# Patient Record
Sex: Female | Born: 1935 | Race: White | Hispanic: No | Marital: Married | Smoking: Never smoker
Health system: Southern US, Community
[De-identification: ages and names within clinical notes are randomized; demographics above are authoritative.]

## PROBLEM LIST (undated history)

## (undated) DIAGNOSIS — K589 Irritable bowel syndrome without diarrhea: Secondary | ICD-10-CM

## (undated) DIAGNOSIS — I10 Essential (primary) hypertension: Secondary | ICD-10-CM

## (undated) DIAGNOSIS — E785 Hyperlipidemia, unspecified: Secondary | ICD-10-CM

## (undated) DIAGNOSIS — F0390 Unspecified dementia without behavioral disturbance: Secondary | ICD-10-CM

## (undated) DIAGNOSIS — M1990 Unspecified osteoarthritis, unspecified site: Secondary | ICD-10-CM

## (undated) DIAGNOSIS — K219 Gastro-esophageal reflux disease without esophagitis: Secondary | ICD-10-CM

## (undated) DIAGNOSIS — E039 Hypothyroidism, unspecified: Secondary | ICD-10-CM

## (undated) HISTORY — PX: ABDOMINAL HYSTERECTOMY: SHX81

---

## 1997-12-29 ENCOUNTER — Ambulatory Visit (HOSPITAL_COMMUNITY): Admission: RE | Admit: 1997-12-29 | Discharge: 1997-12-29 | Payer: Self-pay | Admitting: Obstetrics and Gynecology

## 1998-02-18 ENCOUNTER — Other Ambulatory Visit: Admission: RE | Admit: 1998-02-18 | Discharge: 1998-02-18 | Payer: Self-pay | Admitting: Obstetrics and Gynecology

## 1999-01-19 ENCOUNTER — Encounter: Payer: Self-pay | Admitting: Obstetrics and Gynecology

## 1999-01-19 ENCOUNTER — Ambulatory Visit (HOSPITAL_COMMUNITY): Admission: RE | Admit: 1999-01-19 | Discharge: 1999-01-19 | Payer: Self-pay | Admitting: Obstetrics and Gynecology

## 1999-02-04 ENCOUNTER — Other Ambulatory Visit: Admission: RE | Admit: 1999-02-04 | Discharge: 1999-02-04 | Payer: Self-pay | Admitting: Obstetrics and Gynecology

## 1999-12-03 ENCOUNTER — Encounter: Admission: RE | Admit: 1999-12-03 | Discharge: 1999-12-03 | Payer: Self-pay | Admitting: Family Medicine

## 1999-12-03 ENCOUNTER — Encounter: Payer: Self-pay | Admitting: Family Medicine

## 2000-01-06 ENCOUNTER — Other Ambulatory Visit: Admission: RE | Admit: 2000-01-06 | Discharge: 2000-01-06 | Payer: Self-pay | Admitting: Obstetrics and Gynecology

## 2000-07-26 ENCOUNTER — Encounter: Payer: Self-pay | Admitting: Urology

## 2000-07-31 ENCOUNTER — Observation Stay (HOSPITAL_COMMUNITY): Admission: RE | Admit: 2000-07-31 | Discharge: 2000-08-01 | Payer: Self-pay | Admitting: Urology

## 2000-11-29 ENCOUNTER — Encounter: Admission: RE | Admit: 2000-11-29 | Discharge: 2000-11-29 | Payer: Self-pay | Admitting: Urology

## 2000-11-29 ENCOUNTER — Encounter: Payer: Self-pay | Admitting: Urology

## 2000-12-04 ENCOUNTER — Encounter (INDEPENDENT_AMBULATORY_CARE_PROVIDER_SITE_OTHER): Payer: Self-pay

## 2000-12-04 ENCOUNTER — Ambulatory Visit (HOSPITAL_COMMUNITY): Admission: RE | Admit: 2000-12-04 | Discharge: 2000-12-04 | Payer: Self-pay | Admitting: Urology

## 2001-04-17 ENCOUNTER — Other Ambulatory Visit: Admission: RE | Admit: 2001-04-17 | Discharge: 2001-04-17 | Payer: Self-pay | Admitting: Obstetrics and Gynecology

## 2001-07-16 ENCOUNTER — Other Ambulatory Visit: Admission: RE | Admit: 2001-07-16 | Discharge: 2001-07-16 | Payer: Self-pay | Admitting: Obstetrics and Gynecology

## 2001-12-19 ENCOUNTER — Encounter: Payer: Self-pay | Admitting: *Deleted

## 2001-12-19 ENCOUNTER — Encounter: Admission: RE | Admit: 2001-12-19 | Discharge: 2001-12-19 | Payer: Self-pay | Admitting: *Deleted

## 2002-02-12 ENCOUNTER — Ambulatory Visit (HOSPITAL_COMMUNITY): Admission: RE | Admit: 2002-02-12 | Discharge: 2002-02-12 | Payer: Self-pay | Admitting: Gastroenterology

## 2002-03-28 ENCOUNTER — Ambulatory Visit (HOSPITAL_COMMUNITY): Admission: RE | Admit: 2002-03-28 | Discharge: 2002-03-28 | Payer: Self-pay | Admitting: *Deleted

## 2002-05-20 ENCOUNTER — Ambulatory Visit (HOSPITAL_BASED_OUTPATIENT_CLINIC_OR_DEPARTMENT_OTHER): Admission: RE | Admit: 2002-05-20 | Discharge: 2002-05-20 | Payer: Self-pay | Admitting: Urology

## 2002-05-20 ENCOUNTER — Encounter: Payer: Self-pay | Admitting: Urology

## 2002-07-29 ENCOUNTER — Ambulatory Visit (HOSPITAL_BASED_OUTPATIENT_CLINIC_OR_DEPARTMENT_OTHER): Admission: RE | Admit: 2002-07-29 | Discharge: 2002-07-29 | Payer: Self-pay | Admitting: Urology

## 2002-08-20 ENCOUNTER — Other Ambulatory Visit: Admission: RE | Admit: 2002-08-20 | Discharge: 2002-08-20 | Payer: Self-pay | Admitting: Obstetrics and Gynecology

## 2003-03-03 ENCOUNTER — Encounter (INDEPENDENT_AMBULATORY_CARE_PROVIDER_SITE_OTHER): Payer: Self-pay | Admitting: Specialist

## 2003-03-03 ENCOUNTER — Ambulatory Visit (HOSPITAL_BASED_OUTPATIENT_CLINIC_OR_DEPARTMENT_OTHER): Admission: RE | Admit: 2003-03-03 | Discharge: 2003-03-03 | Payer: Self-pay | Admitting: Urology

## 2003-06-12 ENCOUNTER — Ambulatory Visit (HOSPITAL_COMMUNITY): Admission: RE | Admit: 2003-06-12 | Discharge: 2003-06-12 | Payer: Self-pay | Admitting: *Deleted

## 2003-06-12 ENCOUNTER — Encounter: Payer: Self-pay | Admitting: *Deleted

## 2003-10-07 ENCOUNTER — Other Ambulatory Visit: Admission: RE | Admit: 2003-10-07 | Discharge: 2003-10-07 | Payer: Self-pay | Admitting: Obstetrics and Gynecology

## 2004-01-28 ENCOUNTER — Encounter: Admission: RE | Admit: 2004-01-28 | Discharge: 2004-01-28 | Payer: Self-pay | Admitting: Gastroenterology

## 2004-03-11 ENCOUNTER — Ambulatory Visit (HOSPITAL_COMMUNITY): Admission: RE | Admit: 2004-03-11 | Discharge: 2004-03-11 | Payer: Self-pay | Admitting: Family Medicine

## 2004-05-04 ENCOUNTER — Encounter: Admission: RE | Admit: 2004-05-04 | Discharge: 2004-05-04 | Payer: Self-pay | Admitting: Family Medicine

## 2005-06-29 ENCOUNTER — Encounter: Admission: RE | Admit: 2005-06-29 | Discharge: 2005-06-29 | Payer: Self-pay | Admitting: *Deleted

## 2005-10-10 ENCOUNTER — Encounter: Admission: RE | Admit: 2005-10-10 | Discharge: 2005-10-10 | Payer: Self-pay | Admitting: Orthopedic Surgery

## 2005-10-12 ENCOUNTER — Ambulatory Visit (HOSPITAL_COMMUNITY): Admission: RE | Admit: 2005-10-12 | Discharge: 2005-10-12 | Payer: Self-pay | Admitting: Orthopedic Surgery

## 2005-10-12 ENCOUNTER — Ambulatory Visit (HOSPITAL_BASED_OUTPATIENT_CLINIC_OR_DEPARTMENT_OTHER): Admission: RE | Admit: 2005-10-12 | Discharge: 2005-10-12 | Payer: Self-pay | Admitting: Orthopedic Surgery

## 2005-10-12 ENCOUNTER — Encounter (INDEPENDENT_AMBULATORY_CARE_PROVIDER_SITE_OTHER): Payer: Self-pay | Admitting: *Deleted

## 2007-05-11 ENCOUNTER — Inpatient Hospital Stay (HOSPITAL_COMMUNITY): Admission: EM | Admit: 2007-05-11 | Discharge: 2007-05-14 | Payer: Self-pay | Admitting: Emergency Medicine

## 2010-12-12 ENCOUNTER — Encounter: Payer: Self-pay | Admitting: Family Medicine

## 2011-04-05 NOTE — Op Note (Signed)
Janet Patterson, Janet Patterson               ACCOUNT NO.:  [id number]   MEDICAL RECORD NO.:  [id number]          PATIENT TYPE:  INP   LOCATION:  1616                         FACILITY:  [facility]   PHYSICIAN:  Almedia Balls. Ranell Patrick, M.D. DATE OF BIRTH:  1923/12/27   DATE OF PROCEDURE:  05/11/2007  DATE OF DISCHARGE:                               OPERATIVE REPORT   PREOPERATIVE DIAGNOSIS:  Right hip fracture, valgus impacted femoral  neck.   POSTOPERATIVE DIAGNOSIS:  Right hip fracture, valgus impacted femoral  neck.   PROCEDURE PERFORMED:  In-situ percutaneous cannulated screw fixation of  femoral neck fracture.   ATTENDING SURGEON:  Almedia Balls. Ranell Patrick, M.D.   ASSISTANT:  Donnie Coffin. Dixon, P.A.-C.   General anesthesia was used.   ESTIMATED BLOOD LOSS:  Minimal.   FLUID REPLACEMENT:  1200 mL crystalloid.   Instrument count was correct.  There were no complications.  Perioperative antibiotics given.   INDICATIONS:  The patient is an 77 year old very active female who  presents with a fall onto her right hip.  The patient complained of  immediate pain but was able to ambulate minimally, limping on her right  side.  The patient presents now to the hospital with complaints of hip  pain, radiographs demonstrating a valgus impacted femoral neck fracture  with perfect alignment on the lateral radiograph.  I discussed with the  family the options for treatment for this fracture.  I believe that this  is a stable fracture pattern.  I have discussed with them my  recommendation for a cannulated screw fixation in situ.  The patient and  her family agreed to this.  Informed consent was obtained.   DESCRIPTION OF PROCEDURE:  After an adequate level of anesthesia was  achieved, the patient was positioned on the fracture table, a perineal  post utilized and padded appropriately.  The right leg placed in a  traction boot, minimal traction placed just to keep the leg neutral, the  left hip placed in a  modified lithotomy position and pulses checked once  we placed her there.  We made sure that we did not have excessive  abduction or forward flexion.  At this point we went ahead and sterilely  prepped and draped the right hip.  We went ahead and placed a shower  curtain on the lateral side of the leg.  We then imaged with a C-arm to  identify the starting point for cannulated screws.  We placed the guide  pins in through the lateral hip, starting at an appropriate position on  the lateral portion of the proximal femur and extending up across the  fracture site into the femoral head.  We did three screws that were  placed in triangular geometry into the inferior portion of the head  across the inferior neck in the strongest bone as well as posteriorly.  We had good visualization of the femoral head and we made sure that we  did not have the screws excessively long as to penetrate the femoral  head itself.  Once we were happy with these three cannulated screws,  which were  DePuy 6.5 cannulated screws, we thoroughly irrigated the  wounds, removed the guide pins, imaged with the fluoroscopy to make sure  that AP and lateral views were showing good implant position, and then  went ahead and concluded our surgery with suturing the wounds with 2-0  Vicryl and 4-0 Monocryl.  Steri-Strips applied, followed by sterile  dressing.  The patient tolerated the procedure well and was taken from  the fracture table back onto the recovery room stretcher in stable  condition.      Almedia Balls. Ranell Patrick, M.D.  Electronically Signed     SRN/MEDQ  D:  05/11/2007  T:  05/12/2007  Job:  [id number]

## 2011-04-08 NOTE — Op Note (Signed)
[facility]  Patient:    Janet Patterson, Janet Patterson Visit Number: [id number] MRN: [id number]          Service Type: NES Location: NESC Attending Physician:  Laqueta Jean Dictated by:   Vonzell Schlatter Patsi Sears, M.D. Proc. Date: 05/20/02 Admit Date:  05/20/2002 Discharge Date: 05/20/2002                             Operative Report  PREOPERATIVE DIAGNOSIS:  Vaginal suture erosion.  POSTOPERATIVE DIAGNOSIS:  Vaginal suture erosion.  OPERATION PERFORMED:  Bilateral vaginal vault suture excision.  SURGEON:  Sigmund I. Patsi Sears, M.D.  ANESTHESIA:  General (LMA).  PREPARATION:  After appropriate preanesthesia, the patient was brought to the operating room, placed on the operating table in the dorsal supine position where general LMA anesthesia was introduced. She was then replaced in dorsal lithotomy position where the pubis was prepped with Betadine solution and draped in the usual fashion.  DESCRIPTION OF PROCEDURE:  Vaginal vault inspection revealed bilateral Prolene suture erosion. The patient had originally complained about suture erosion on the right side and this site was addressed first. The area of interest was excised, and oversewn with 3-0 Vicryl suture. A right sided suture was also identified, and this was simply clipped at the area of the vaginal epithelium. There was a mild abrasion on the right side of the vaginal introitus, but no abnormality of the vagina was noted. The patient tolerated the procedure well and was awakened and taken to the recovery room in good condition. He was given a B&O suppository. Dictated by:   Vonzell Schlatter Patsi Sears, M.D. Attending Physician:  Laqueta Jean DD:  05/20/02 TD:  05/22/02 Job: [id number]

## 2011-04-08 NOTE — Op Note (Signed)
   NAME:  Janet Patterson, Janet Patterson                         ACCOUNT NO.:  [id number]   MEDICAL RECORD NO.:  [id number]                   PATIENT TYPE:  AMB   LOCATION:  NESC                                 FACILITY:  [facility]   PHYSICIAN:  Sigmund I. Patsi Sears, M.D.         DATE OF BIRTH:  25-Mar-1924   DATE OF PROCEDURE:  03/03/2003  DATE OF DISCHARGE:                                 OPERATIVE REPORT   PREOPERATIVE DIAGNOSES:  Bladder neck nodule.   POSTOPERATIVE DIAGNOSES:  Bladder scar.   OPERATION:  Cystourethroscopy, cold cup bladder biopsy, cauterization of the  biopsy site.   SURGEON:  Sigmund I. Patsi Sears, M.D.   ANESTHESIA:  General LMA.   PREOP PREPARATION:  After appropriate preanesthesia, the patient was brought  to the operating room, placed on the operating table in the dorsal supine  position where general mask anesthesia was introduced. She was then replaced  in the dorsal lithotomy position where the pubis was prepped with Betadine  solution and draped in the usual fashion.   HISTORY:  This 77 year old female, is status post pubovaginal sling in 2000,  with examination in 2003 showing a permanent suture eroding into the vagina,  which was removed. The patient is also status post collagen injection, with  a history of low leak point pressure of 17 mL/second. Her last injection was  September 2003. She complains of cystitis type symptoms, although urinalysis  has been negative. Cystoscopy showed a bladder neck nodule and she is now  for biopsy.   DESCRIPTION OF PROCEDURE:  Cystoscopy again shows bladder neck nodules,  basically in the 6 o'clock position. This may be scarring from her collagen,  and cold cup bladder biopsies accomplished, sent to the laboratory for  evaluation. Minor bleeding is noted and this is cauterized. The patient is  hydrodistended as well. The patient tolerated the procedure well and was  awakened and taken to the recovery room in good  condition.                                                Sigmund I. Patsi Sears, M.D.    SIT/MEDQ  D:  03/03/2003  T:  03/03/2003  Job:  [id number]

## 2011-04-08 NOTE — Op Note (Signed)
Janet Patterson, Janet Patterson               ACCOUNT NO.:  [id number]   MEDICAL RECORD NO.:  [id number]          PATIENT TYPE:  AMB   LOCATION:  DSC                          FACILITY:  [facility]   PHYSICIAN:  Cindee Salt, M.D.       DATE OF BIRTH:  [birth year]   DATE OF PROCEDURE:  10/12/2005  DATE OF DISCHARGE:                                 OPERATIVE REPORT   PREOPERATIVE DIAGNOSIS:  Mucoid cyst of the distal interphalangeal joint,  right index finger.   POSTOPERATIVE DIAGNOSIS:  Mucoid cyst of the distal interphalangeal joint,  right index finger.   OPERATION:  Excision mucoid cyst in the distal interphalangeal joint, right  index finger.   SURGEON:  Cindee Salt, M.D.   ASSISTANTCarolyne Fiscal, R.N.   ANESTHESIA:  Forearm based IV regional.   HISTORY:  The patient is a 77 year old female with a history of a mass that  has been biopsied on the distal interphalangeal joint right index finger  revealing mucoid cyst. She is admitted now for excision, being referred from  her dermatologist.   DESCRIPTION OF PROCEDURE:  The patient marked her finger, along with the  surgeon, in the preoperative area. She was brought to the operating room  where a forearm based IV regional anesthetic was carried out without  difficulty. She was prepped using DuraPrep, supine position, right arm free.   A curvilinear incision was made over the mass and then onto the radial  aspect of the middle phalanx, carried down through subcutaneous tissue. The  cyst was directly under the skin and extruded a mucinous clear fluid.  With  blunt sharp dissection, this was then dissected free.  This was on the  radial aspect of the distal interphalangeal joint. The area opened, the  extensor tendon was protected. The osteophytes on the middle phalanx were  then removed with a rongeur across the entire dorsum of the finger. No  further lesions were identified.   The wound was irrigated. The skin was then closed with interrupted 5-0  nylon  sutures. Sterile compressive dressing was applied. The patient tolerated the  procedure well, and was taken to the recovery room for observation in  satisfactory condition. She is discharged home to return to the [facility]  [facility] [place] 1 week on Vicodin.           ______________________________  Cindee Salt, M.D.     GK/MEDQ  D:  10/12/2005  T:  10/12/2005  Job:  [id number]

## 2011-04-08 NOTE — Procedures (Signed)
[facility]  Patient:    KIM, OKI Visit Number: [id number] MRN: [id number]          Service Type: END Location: ENDO Attending Physician:  Orland Mustard Dictated by:   Llana Aliment. Randa Evens, M.D. Proc. Date: 02/12/02 Admit Date:  02/12/2002   CC:         Armstead Peaks, M.D.   Procedure Report  DATE OF BIRTH:  01-17-1924  PROCEDURE:  Colonoscopy.  MEDICATIONS:  Fentanyl 75 mcg, Versed 7 mg IV.  SCOPE:  Olympus pediatric video colonoscope.  INDICATION:  Colon cancer screening.  DESCRIPTION OF PROCEDURE:  The procedure had been explained to the patient and consent obtained.  The patient in the left lateral decubitus position, the Olympus pediatric video colonoscope inserted, advanced under direct visualization.  The prep was excellent.  We were able to advance to the cecum using abdominal pressure and position changes.  The cecum, ascending colon, hepatic flexure, transverse colon, splenic flexure, descending and sigmoid colon were seen well upon withdrawal.  No polyps were seen throughout the entire colon.  Slight diverticulosis in the sigmoid colon.  The scope was withdrawn.  The patient tolerated the procedure well, was maintained on low-flow oxygen and pulse oximeter throughout the procedure.  ASSESSMENT:  No evidence of colon polyps.  PLAN:  We will have the patient follow up with routine yearly Hemoccults, and she will be seen by Dr. Armstead Peaks for physicals.  We will recommended another colon screening procedure in 5-10 years, depending on the recommendations at that time. Dictated by:   Llana Aliment. Randa Evens, M.D. Attending Physician:  Orland Mustard DD:  02/12/02 TD:  02/12/02 Job: [id number]

## 2011-04-08 NOTE — Op Note (Signed)
[facility]  Patient:    Janet Patterson, Janet Patterson                      MRN: [id number] Proc. Date: 07/31/00 Adm. Date:  [id number] Disc. Date: [id number] Attending:  Laqueta Jean CC:         Desma Maxim, M.D.   Operative Report  PREOPERATIVE DIAGNOSIS:  Type 3 urinary incontinence.  POSTOPERATIVE DIAGNOSIS:  Type 3 urinary incontinence.  OPERATION PERFORMED:  Pubovaginal sling with anterior bladder base repair.  SURGEON:  Sigmund I. Patsi Sears, M.D.  ANESTHESIA:  General endotracheal.  PREPARATION:  After appropriate preanesthesia, the patient was brought to the operating room and placed on the operating table in the dorsal supine position where general endotracheal anesthesia was introduced.  She was then replaced in the low Allen stirrup dorsal lithotomy position where the pubis was prepped with Betadine solution and draped in the usual fashion.  INDICATIONS FOR PROCEDURE:  This 77 year old female has a history of a Raz urethropexy in 1986 for cough-laugh-sneeze incontinence.  She currently complains of cough-laugh-sneeze incontinence as well as urgent urge incontinence.  Urodynamics showed that the patient had a negative Marshall test, a negative Q-tip test, but total urinary incontinence with bladder evacuation in the standing position, with the ability to cut off the flow. The patient failed anticholinergic therapy, and now is for pubovaginal sling surgery.  DESCRIPTION OF PROCEDURE:  20 cc of Marcaine 0.5% with epinephrine was injected into the pubovaginal cervical arch tissue.  A semilunar incision was then made from the 8 oclock position, to the 12 oclock position, around to the 5 oclock position.  Subcutaneous tissues was dissected with sharp and blunt dissection into the retropubic space.  An anterior bladder wall flap was created, and two separate horizontal mattress 3-0 Vicryl sutures were used to create an anterior vaginal  vault repair.  The anchor system was then placed transvaginally into the retropubic space and this then was placed without difficulty.  A 7 cm portion of Tutoplast fascia was then folded, and placed into the retropubic space, around the urethra.  It was sutured in place, with care taken to avoid placement of tissue too tightly.  It was necessary to provide urethrolysis prior to placement of the sling.  Following this, the sling was ligated, without tension.  The vaginal incision was closed in two layers with 3-0 Vicryl suture, and cystoscopy showed no suture within the bladder.  Packing was placed, a Foley catheter was replaced.  The patient was then awakened after being given 15 mg of IV Toradol and taken to the recovery room in good condition. DD:  07/31/00 TD:  08/01/00 Job: [id number]

## 2011-04-08 NOTE — Discharge Summary (Signed)
Janet Patterson               ACCOUNT NO.:  [id number]   MEDICAL RECORD NO.:  [id number]          PATIENT TYPE:  INP   LOCATION:  1616                         FACILITY:  [facility]   PHYSICIAN:  Almedia Balls. Ranell Patrick, M.D. DATE OF BIRTH:  11-17-1924   DATE OF ADMISSION:  05/11/2007  DATE OF DISCHARGE:  05/14/2007                               DISCHARGE SUMMARY   ADMISSION DIAGNOSES:  Right femur fracture.   DISCHARGE DIAGNOSIS:  Right femur fracture status post percutaneous  pinning.   BRIEF HISTORY:  The patient is a 77 year old female who fell onto her  right hip earlier on June 20th.  The patient presented to the emergency  department with right hip pain and inability to walk.  She denied any  other injuries, and she was to have a right hip percutaneous pinning by  Dr. Malon Kindle.   PROCEDURE:  The patient had a right hip percutaneous pinning by Dr.  Malon Kindle on May 11, 2007.   ASSISTANT:   ESTIMATED BLOOD LOSS:  Minimal, no complications.   HOSPITAL COURSE:  The patient was admitted on May 11, 2007 for the  above-stated procedure, after which she had adequate time in the post-  anesthesia care unit.  She was transferred up to [address].  Post-op day  #1, the patient complained about some mild pain with that right hip but  overall was doing fairly well.  She was able to work with physical  therapy mildly, to work on some general weightbearing and touchdown  weightbearing with a rolling walker.  Her wound was healing well, and  all blood work was within normal limits.  Post-op day #2 and 3, the  patient continued to improve in regards to therapy, and her pain was  very minimal; thus, the plan was to be discharged home after physical  therapy on post-op day #3.  The patient was afebrile.  Labs were within  normal limits, and she did well.   DISCHARGE/PLAN:  The patient will be discharged home on May 14, 2007.   FOLLOW UP:  The patient will follow back up with Dr. Malon Kindle in 2  weeks.  Her condition is stable.  Diet is a regular.   DISCHARGE MEDICATIONS:  1. Darvocet N-100 1-2 tablets q.4-6 h p.r.n. pain.  2. Robaxin 1 tab q.4-6 h. p.r.n. spasm.  3. Crestor.  4. Synthroid.   ALLERGIES:  THE PATIENT HAS NO KNOWN DRUG ALLERGIES.      Thomas B. Durwin Nora, P.A.      Almedia Balls. Ranell Patrick, M.D.  Electronically Signed    TBD/MEDQ  D:  05/15/2007  T:  05/15/2007  Job:  [id number]

## 2011-04-08 NOTE — Op Note (Signed)
[facility]  Patient:    Janet Patterson, Janet Patterson                      MRN: [id number] Proc. Date: 12/04/00 Adm. Date:  [id number] Attending:  Laqueta Jean                           Operative Report  PREOPERATIVE DIAGNOSES:  Trigonitis.  POSTOPERATIVE DIAGNOSES:  Trigonitis.  OPERATION PERFORMED:  Cystourethroscopy, cold cut bladder biopsy, cauterization of trigone and biopsy sites.  SURGEON:  Dr. Patsi Sears.  ANESTHESIA:  General (LMA).  PREPARATION:  After appropriate preanesthesia, the patient was brought to the operating room, placed on the operating table in the dorsal supine position where general LMA anesthesia was introduced. She remained in this position where the pubis was prepped with Betadine solution and draped in the usual fashion.  HISTORY OF PRESENT ILLNESS:  This patient returns today with a history of chronic cystitis and urinalysis showing too numerous to count white blood cells, despite the fact she has urine culture negative. She has CT scan showing negative for masses. She is now for cystoscopy and bladder biopsy.  DESCRIPTION OF PROCEDURE:  The cystoscope was placed in the bladder without difficulty. Cold cut bladder biopsy is accomplished, and cauterization of the biopsy sites is obtained. Cauterization of the trigone is accomplished, and the patient is given xylocaine jelly, B&O suppository, IV Toradol, awakened, and taken to recovery in good condition. DD:  12/04/00 TD:  12/04/00 Job: [id number]

## 2011-04-08 NOTE — Op Note (Signed)
   Janet Patterson, Janet Patterson                        ACCOUNT NO.:  [id number]   MEDICAL RECORD NO.:  [id number]                   PATIENT TYPE:  AMB   LOCATION:  NESC                                 FACILITY:  [facility]   PHYSICIAN:  Sigmund I. Patsi Sears, M.D.         DATE OF BIRTH:  1924/09/14   DATE OF PROCEDURE:  07/29/2002  DATE OF DISCHARGE:                                 OPERATIVE REPORT   PREOPERATIVE DIAGNOSIS:  Sphincter urinary incontinence.   POSTOPERATIVE DIAGNOSIS:  Periurethral sphincter urinary incontinence.   PROCEDURE:  Collagen implantation.   SURGEON:  Sigmund I. Patsi Sears, M.D.   ANESTHESIA:  MAC.   DESCRIPTION OF PROCEDURE:  After appropriate preanesthesia, the patient was  brought to the operating room, placed on the operating table in the dorsal  supine position, where she underwent IV sedation.  Cystoscopy was  accomplished, and it showed a wide open bladder neck.  The patient has a  known leak point pressure of only 17 cm of water.  She undergoes multiple  injections of collagen in the periurethral area (four tubes) and has  excellent closure of her bladder neck.  The patient understands that 50% of  the closure will reopen because it is absorbable alcohol, and she may need a  second injection.  The patient tolerated the procedure quite well, and the  bladder was drained of fluid.  She was awakened and taken to the recovery  room in good condition.                                               Sigmund I. Patsi Sears, M.D.    SIT/MEDQ  D:  07/29/2002  T:  07/29/2002  Job:  [id number]

## 2011-09-07 LAB — BASIC METABOLIC PANEL
BUN: 12
CO2: 28
CO2: 31
Calcium: 9.5
Chloride: 104
Chloride: 107
GFR calc Af Amer: >60
GFR calc Af Amer: >60
GFR calc non Af Amer: >60
Potassium: 4.3
Sodium: 142

## 2011-09-07 LAB — CBC
HCT: 42
Hemoglobin: 14
Hemoglobin: 15.6 — ABNORMAL HIGH
MCHC: 33.2
MCHC: 33.8
MCV: 86
Platelets: 165
RBC: 4.84
RDW: 15.1 — ABNORMAL HIGH
RDW: 15.5 — ABNORMAL HIGH
WBC: 7.6

## 2011-09-07 LAB — APTT: aPTT: 24

## 2011-09-07 LAB — URINALYSIS, ROUTINE W REFLEX MICROSCOPIC
Glucose, UA: NEGATIVE
Nitrite: NEGATIVE
Urobilinogen, UA: 0.2

## 2011-09-07 LAB — DIFFERENTIAL
Eosinophils Relative: 2
Lymphs Abs: 1.1
Monocytes Absolute: 0.5
Neutro Abs: 7.2

## 2011-09-07 LAB — PROTIME-INR
INR: 1
Prothrombin Time: 13.6

## 2013-12-13 ENCOUNTER — Encounter (HOSPITAL_COMMUNITY): Payer: Self-pay | Admitting: Emergency Medicine

## 2013-12-13 ENCOUNTER — Emergency Department (HOSPITAL_COMMUNITY): Payer: Medicare Other

## 2013-12-13 ENCOUNTER — Emergency Department (HOSPITAL_COMMUNITY)
Admission: EM | Admit: 2013-12-13 | Discharge: 2013-12-13 | Disposition: A | Discharge status: Home / Self Care | Payer: Medicare Other | Attending: Emergency Medicine | Admitting: Emergency Medicine

## 2013-12-13 DIAGNOSIS — Z792 Long term (current) use of antibiotics: Secondary | ICD-10-CM | POA: Insufficient documentation

## 2013-12-13 DIAGNOSIS — L03119 Cellulitis of unspecified part of limb: Secondary | ICD-10-CM

## 2013-12-13 DIAGNOSIS — L8993 Pressure ulcer of unspecified site, stage 3: Secondary | ICD-10-CM

## 2013-12-13 DIAGNOSIS — M129 Arthropathy, unspecified: Secondary | ICD-10-CM | POA: Insufficient documentation

## 2013-12-13 DIAGNOSIS — L02419 Cutaneous abscess of limb, unspecified: Secondary | ICD-10-CM | POA: Insufficient documentation

## 2013-12-13 DIAGNOSIS — L97209 Non-pressure chronic ulcer of unspecified calf with unspecified severity: Secondary | ICD-10-CM | POA: Insufficient documentation

## 2013-12-13 DIAGNOSIS — K219 Gastro-esophageal reflux disease without esophagitis: Secondary | ICD-10-CM | POA: Insufficient documentation

## 2013-12-13 DIAGNOSIS — L0390 Cellulitis, unspecified: Secondary | ICD-10-CM | POA: Diagnosis present

## 2013-12-13 DIAGNOSIS — Z79899 Other long term (current) drug therapy: Secondary | ICD-10-CM | POA: Insufficient documentation

## 2013-12-13 DIAGNOSIS — I10 Essential (primary) hypertension: Secondary | ICD-10-CM | POA: Insufficient documentation

## 2013-12-13 DIAGNOSIS — E785 Hyperlipidemia, unspecified: Secondary | ICD-10-CM | POA: Insufficient documentation

## 2013-12-13 DIAGNOSIS — L0291 Cutaneous abscess, unspecified: Secondary | ICD-10-CM

## 2013-12-13 DIAGNOSIS — F0390 Unspecified dementia without behavioral disturbance: Secondary | ICD-10-CM | POA: Insufficient documentation

## 2013-12-13 DIAGNOSIS — M1990 Unspecified osteoarthritis, unspecified site: Secondary | ICD-10-CM | POA: Insufficient documentation

## 2013-12-13 DIAGNOSIS — E039 Hypothyroidism, unspecified: Secondary | ICD-10-CM | POA: Insufficient documentation

## 2013-12-13 HISTORY — DX: Irritable bowel syndrome, unspecified: K58.9

## 2013-12-13 HISTORY — DX: Essential (primary) hypertension: I10

## 2013-12-13 HISTORY — DX: Unspecified dementia, unspecified severity, without behavioral disturbance, psychotic disturbance, mood disturbance, and anxiety: F03.90

## 2013-12-13 HISTORY — DX: Unspecified osteoarthritis, unspecified site: M19.90

## 2013-12-13 HISTORY — DX: Hyperlipidemia, unspecified: E78.5

## 2013-12-13 HISTORY — DX: Gastro-esophageal reflux disease without esophagitis: K21.9

## 2013-12-13 HISTORY — DX: Hypothyroidism, unspecified: E03.9

## 2013-12-13 LAB — CBC WITH DIFFERENTIAL/PLATELET
Basophils Absolute: 0 10*3/uL (ref 0.0–0.1)
Basophils Relative: 0 % (ref 0–1)
EOS ABS: 0.8 10*3/uL — AB (ref 0.0–0.7)
EOS PCT: 9 % — AB (ref 0–5)
HCT: 34.4 % — ABNORMAL LOW (ref 36.0–46.0)
HEMOGLOBIN: 11.3 g/dL — AB (ref 12.0–15.0)
LYMPHS PCT: 23 % (ref 12–46)
Lymphs Abs: 2 10*3/uL (ref 0.7–4.0)
MCH: 28.5 pg (ref 26.0–34.0)
MCHC: 32.8 g/dL (ref 30.0–36.0)
MCV: 86.9 fL (ref 78.0–100.0)
MONO ABS: 0.8 10*3/uL (ref 0.1–1.0)
MONOS PCT: 9 % (ref 3–12)
NEUTROS ABS: 4.9 10*3/uL (ref 1.7–7.7)
NEUTROS PCT: 58 % (ref 43–77)
Platelets: 258 10*3/uL (ref 150–400)
RBC: 3.96 MIL/uL (ref 3.87–5.11)
RDW: 15.2 % (ref 11.5–15.5)
WBC: 8.4 10*3/uL (ref 4.0–10.5)

## 2013-12-13 LAB — CG4 I-STAT (LACTIC ACID): Lactic Acid, Venous: 0.47 mmol/L — ABNORMAL LOW (ref 0.5–2.2)

## 2013-12-13 LAB — COMPREHENSIVE METABOLIC PANEL
ALK PHOS: 68 U/L (ref 39–117)
ALT: 9 U/L (ref 0–35)
AST: 15 U/L (ref 0–37)
Albumin: 2.7 g/dL — ABNORMAL LOW (ref 3.5–5.2)
BUN: 10 mg/dL (ref 6–23)
CHLORIDE: 107 meq/L (ref 96–112)
CO2: 27 meq/L (ref 19–32)
Calcium: 9.1 mg/dL (ref 8.4–10.5)
Creatinine, Ser: 0.88 mg/dL (ref 0.50–1.10)
GFR calc Af Amer: 67 mL/min — ABNORMAL LOW (ref >90)
GFR, EST NON AFRICAN AMERICAN: 57 mL/min — AB (ref >90)
GLUCOSE: 82 mg/dL (ref 70–99)
Potassium: 3.9 mEq/L (ref 3.7–5.3)
SODIUM: 142 meq/L (ref 137–147)
Total Bilirubin: 0.3 mg/dL (ref 0.3–1.2)
Total Protein: 6.7 g/dL (ref 6.0–8.3)

## 2013-12-13 MED ORDER — DOXYCYCLINE HYCLATE 100 MG PO TABS
100.0000 mg | ORAL_TABLET | Freq: Once | ORAL | Status: AC
  Administered 2013-12-13: 100 mg via ORAL
  Filled 2013-12-13: qty 1

## 2013-12-13 MED ORDER — DOXYCYCLINE HYCLATE 100 MG PO CAPS: ORAL_CAPSULE | ORAL | Status: DC

## 2013-12-13 MED ORDER — VANCOMYCIN HCL IN DEXTROSE 1-5 GM/200ML-% IV SOLN
1000.0000 mg | INTRAVENOUS | Status: DC
Start: 2013-12-13 — End: 2013-12-13
  Filled 2013-12-13: qty 200

## 2013-12-13 MED ORDER — SODIUM CHLORIDE 0.9 % IV BOLUS (SEPSIS)
500.0000 mL | Freq: Once | INTRAVENOUS | Status: AC
  Administered 2013-12-13: 500 mL via INTRAVENOUS

## 2013-12-13 NOTE — ED Provider Notes (Signed)
Medical screening examination/treatment/procedure(s) were conducted as a shared visit with non-physician practitioner(s) and myself.  I personally evaluated the patient during the encounter.  EKG Interpretation   None      Patient with left lower extremity erythema and swelling consistent with cellulitis. She was started on antibiotics and will be admitted to the hospital  Janet BakerAnthony T Erendira Crabtree, MD 12/13/13 1416

## 2013-12-13 NOTE — ED Notes (Signed)
Pt reports with left leg pain. Pt nephew reports ulcer to left lower leg for 2-3 months. Upon assessment pt leg red, swollen, and warm to touch. Pt rates pain 5/10.    [facility] and given Synvisc injections to knees bilaterally and was started on Keflex. Nephew reports pt started breaking out with rash to legs bilaterally so stopped Keflex thinking that it had caused the rash.

## 2013-12-13 NOTE — Progress Notes (Signed)
   CARE MANAGEMENT ED NOTE 12/13/2013  Patient:  Janet Patterson,Janet Patterson   Account Number:  [id number]  Date Initiated:  12/13/2013  Documentation initiated by:  Radford PaxFERRERO,Dequavion Follette  Subjective/Objective Assessment:   Patient presents to ED with left lower leg wound     Subjective/Objective Assessment Detail:   Patient with history of dementia.     Action/Plan:   Action/Plan Detail:   Patient to be discharged back to [facility], follow up with wound care clinic.   Anticipated DC Date:  12/13/2013     Status Recommendation to Physician:   Result of Recommendation:    Other ED Services  Consult Working Plan    DC Planning Services  Other  PCP issues    Choice offered to / List presented to:    DME arranged  WHEELCHAIR - MANUAL     DME agency  [facility]        Status of service:  Completed, signed off  ED Comments:   ED Comments Detail:  EDCM spoke to EDPA regarding discharge status.  As per EDPA patient is being discharged tonite. Patient will follow up with wound care clinic and be prescribed doxycycline po. As per [facility] hospitalist note, recommending wheelchair as patient has difficulty bearing weight on her lower extremity.  Patient is a [facility]. EDCM called Judeth CornfieldStephanie of [facility] for wheelchair.  As per Judeth CornfieldStephanie, wheelchair will be delivered to ED tonite. Patient is 5'3, 135 pounds and does not currently have a wheelchair.  EDCM spoke to patient's nephew Janet Patterson at [phone] to let him know his aunt was being discharged this evening, that she will be receiving a wheelchair and that discharge instructions wil be explained to him in detail when he picks his aunt up from the Ed this evening. Patient's nephew confirmed patient's pcp is Dr. Elbert EwingsWilliam Randell Patterson of [facility].  Patient's nephew verbalized understanding.   Patient's nephew thankful for services.   No further EDCM needs at this time.

## 2013-12-13 NOTE — ED Notes (Signed)
Pt to DG tibia/fibula left.

## 2013-12-13 NOTE — ED Provider Notes (Signed)
CSN: [id number]     Arrival date & time 12/13/13  1143 History   First MD Initiated Contact with Patient 12/13/13 1159     Chief Complaint  Patient presents with  . Leg Pain   (Consider location/radiation/quality/duration/timing/severity/associated sxs/prior Treatment) The history is provided by the patient and a relative. No language interpreter was used.  Janet Patterson is an 80 year old female with past medical history of dementia, hypertension, hyperlipidemia, osteoarthritis presenting to emergency department with nephew regarding swelling and erythema to the left lower extremity. As per nephew's report, reported that patient lives alone at [facility] in [place], [place]. Reported that beginning this week, 12/09/2013 Monday, nephew reported there was redness and swelling localized left lower extremity. As per nephew's report stated that the redness and swelling got progressively worse. Patient reported that there is an "uncomfortable" pain associated with the left lower extremity that is constant and worse with motion. Patient was seen and assessed by Dr. Despina Hick, orthopedic surgeon, on Tuesday 12/12/2013 where synovial fluid injection was administered to bilateral knees-was recommended to start Keflex. As per nephew's report, reported that patient started to be develop a rash so Keflex was stopped yesterday. Denied fever, chills, and nausea, vomiting, diarrhea. Patient caveat 5 due to dementia.   Past Medical History  Diagnosis Date  . Dementia   . Hypertension   . Hyperlipemia   . Hypothyroidism   . GERD (gastroesophageal reflux disease)   . IBS (irritable bowel syndrome)   . Osteoarthritis    Past Surgical History  Procedure Laterality Date  . Abdominal hysterectomy     No family history on file. History  Substance Use Topics  . Smoking status: Never Smoker   . Smokeless tobacco: Not on file  . Alcohol Use: No   OB History   Grav Para Term Preterm Abortions TAB  SAB Ect Mult Living                 Review of Systems  Unable to perform ROS: Dementia  Constitutional: Negative for fever and chills.  Respiratory: Negative for chest tightness and shortness of breath.   Musculoskeletal: Positive for arthralgias (Left leg pain), joint swelling and myalgias.  Skin: Positive for rash and wound (Ulcer noted to the lateral aspect of the mid-tib-fib of the left lower extremity).  All other systems reviewed and are negative.    Allergies  Keflex  Home Medications   Current Outpatient Rx  Name  Route  Sig  Dispense  Refill  . escitalopram (LEXAPRO) 10 MG tablet   Oral   Take 10 mg by mouth daily.         . lansoprazole (PREVACID) 30 MG capsule   Oral   Take 30 mg by mouth daily at 12 noon.         Marland Kitchen levothyroxine (SYNTHROID, LEVOTHROID) 75 MCG tablet   Oral   Take 75 mcg by mouth daily before breakfast.         . rOPINIRole (REQUIP) 2 MG tablet   Oral   Take 2 mg by mouth at bedtime.         . rosuvastatin (CRESTOR) 10 MG tablet   Oral   Take 10 mg by mouth daily.         Marland Kitchen torsemide (DEMADEX) 20 MG tablet   Oral   Take 40 mg by mouth 2 (two) times daily.         . traMADol (ULTRAM) 50 MG tablet   Oral  Take 50-100 mg by mouth every 6 (six) hours as needed for moderate pain.         Marland Kitchen. doxycycline (VIBRAMYCIN) 100 MG capsule      Take 1 tablet PO BID for 10 days.   20 capsule   0    BP 131/63  Pulse 70  Temp(Src) 97.4 F (36.3 C) (Oral)  Resp 18  Ht 5\' 5"  (1.651 m)  Wt 135 lb (61.236 kg)  BMI 22.47 kg/m2  SpO2 96% Physical Exam  Nursing note and vitals reviewed. Constitutional: She is oriented to person, place, and time. She appears well-developed and well-nourished. No distress.  HENT:  Head: Normocephalic and atraumatic.  Eyes: Conjunctivae and EOM are normal. Pupils are equal, round, and reactive to light. Right eye exhibits no discharge. Left eye exhibits no discharge.  Neck: Normal range of motion.  Neck supple.  Negative neck stiffness Negative nuchal rigidity Cervical lymphadenopathy  Cardiovascular: Normal rate, regular rhythm and normal heart sounds.  Exam reveals no friction rub.   No murmur heard. Pulses:      Radial pulses are 2+ on the right side, and 2+ on the left side.       Dorsalis pedis pulses are 2+ on the right side, and 2+ on the left side.  Cap refill less than 3 seconds  Pulmonary/Chest: Effort normal and breath sounds normal. No respiratory distress. She has no wheezes. She has no rales.  Musculoskeletal: Normal range of motion.       Legs: Decreased range of motion noted to the left ankle secondary to pain. Patient has full range of motion of the digits of the left foot without difficulty or ataxia. Patient is able to flex and extend the knee without difficulty. Full range of motion to left hip.   Neurological: She is alert and oriented to person, place, and time. She exhibits normal muscle tone. Coordination normal.  Cranial nerves III-XII grossly intact Strength 5+/5+ to upper and lower extremities bilaterally with resistance applied, equal distribution noted Strength intact to the digits of the left foot without difficulty Sensation intact  Skin: She is not diaphoretic. There is erythema.  Circumferential erythema, swelling noted to the left lower extremity-affecting the tibiofibular region. Erythema ranging from dorsal aspect of the left foot extending to just below the right knee. Warmth upon palpation. Discomfort upon palpation. Cracked skin noted. Stage III ulcer identified to the lateral aspects of the mid-tib-fib region of the left lower extremity. Negative active drainage noted. Margins clear-granulation tissue noted.   Psychiatric: She has a normal mood and affect. Her behavior is normal. Thought content normal.    ED Course  Procedures (including critical care time)  3:13 PM This provider spoke with Dr. Cena BentonVega, [facility] - discussed case,  history, presentation, labs and imaging with physician. As per physician, does not think patient needs to come at this time. Reported that he will come down and assess the patient and make a decision whether or not the patient will be admitted.   3:37 PM This provider spoke with Dr. Cena BentonVega, [facility] - patient was seen and assessed by this provider. Stated that patient does not have to be admitted to the hospital for IV antibiotics regarding cellulitic infection. Stated that patient can be discharged home with Doxycycline and follow-up within one week. [facility] consult performed and cleared patient for discharge.   4:26 PM Case Manger to get patient wheelchair due to pain with walking on left leg.  6:07 PM  This provider spoke with the nephew regarding plan for discharge. Discussed with nephew the hospital as did not plan on admitting patient for IV antibiotics-recommended by mouth antibiotics file. Discussed with nephew course of antibiotics. Discussed with nephew that if swelling, erythema, infection spread past the line of demarcation-the pen line-or patient develops fever she is to report back to emergency department. Nephew understood and agreed to plan.    Results for orders placed during the hospital encounter of 12/13/13  CBC WITH DIFFERENTIAL      Result Value Range   WBC 8.4  4.0 - 10.5 K/uL   RBC 3.96  3.87 - 5.11 MIL/uL   Hemoglobin 11.3 (*) 12.0 - 15.0 g/dL   HCT 11.9 (*) 14.7 - 82.9 %   MCV 86.9  78.0 - 100.0 fL   MCH 28.5  26.0 - 34.0 pg   MCHC 32.8  30.0 - 36.0 g/dL   RDW 56.2  13.0 - 86.5 %   Platelets 258  150 - 400 K/uL   Neutrophils Relative % 58  43 - 77 %   Neutro Abs 4.9  1.7 - 7.7 K/uL   Lymphocytes Relative 23  12 - 46 %   Lymphs Abs 2.0  0.7 - 4.0 K/uL   Monocytes Relative 9  3 - 12 %   Monocytes Absolute 0.8  0.1 - 1.0 K/uL   Eosinophils Relative 9 (*) 0 - 5 %   Eosinophils Absolute 0.8 (*) 0.0 - 0.7 K/uL   Basophils Relative 0  0 - 1 %    Basophils Absolute 0.0  0.0 - 0.1 K/uL  COMPREHENSIVE METABOLIC PANEL      Result Value Range   Sodium 142  137 - 147 mEq/L   Potassium 3.9  3.7 - 5.3 mEq/L   Chloride 107  96 - 112 mEq/L   CO2 27  19 - 32 mEq/L   Glucose, Bld 82  70 - 99 mg/dL   BUN 10  6 - 23 mg/dL   Creatinine, Ser 7.84  0.50 - 1.10 mg/dL   Calcium 9.1  8.4 - 69.6 mg/dL   Total Protein 6.7  6.0 - 8.3 g/dL   Albumin 2.7 (*) 3.5 - 5.2 g/dL   AST 15  0 - 37 U/L   ALT 9  0 - 35 U/L   Alkaline Phosphatase 68  39 - 117 U/L   Total Bilirubin 0.3  0.3 - 1.2 mg/dL   GFR calc non Af Amer 57 (*) >90 mL/min   GFR calc Af Amer 67 (*) >90 mL/min  CG4 I-STAT (LACTIC ACID)      Result Value Range   Lactic Acid, Venous 0.47 (*) 0.5 - 2.2 mmol/L   Dg Tibia/fibula Left  12/13/2013   CLINICAL DATA:  Left lower leg redness, swelling, and pain for 1 month. Wound to the lateral lower leg.  EXAM: LEFT TIBIA AND FIBULA - 2 VIEW  COMPARISON:  Left knee radiographs 02/14/2007  FINDINGS: Soft tissue defect is seen laterally in the distal lower leg. No dissecting soft tissue emphysema seen. Bones are diffusely osteopenic. There is no evidence of fracture. Knee and ankle are approximated. No knee joint effusion is seen. There is diffuse reticulation of the fat throughout the visualized leg. Vascular calcifications are noted.  IMPRESSION: Soft tissue defect in the lateral, distal leg with diffuse soft tissue swelling. No dissecting soft tissue emphysema or acute osseous abnormality identified.   Electronically Signed   By: Sebastian Ache  On: 12/13/2013 13:36   Labs Review Labs Reviewed  CBC WITH DIFFERENTIAL - Abnormal; Notable for the following:    Hemoglobin 11.3 (*)    HCT 34.4 (*)    Eosinophils Relative 9 (*)    Eosinophils Absolute 0.8 (*)    All other components within normal limits  COMPREHENSIVE METABOLIC PANEL - Abnormal; Notable for the following:    Albumin 2.7 (*)    GFR calc non Af Amer 57 (*)    GFR calc Af Amer 67 (*)     All other components within normal limits  CG4 I-STAT (LACTIC ACID) - Abnormal; Notable for the following:    Lactic Acid, Venous 0.47 (*)    All other components within normal limits  CULTURE, BLOOD (ROUTINE X 2)  CULTURE, BLOOD (ROUTINE X 2)   Imaging Review Dg Tibia/fibula Left  12/13/2013   CLINICAL DATA:  Left lower leg redness, swelling, and pain for 1 month. Wound to the lateral lower leg.  EXAM: LEFT TIBIA AND FIBULA - 2 VIEW  COMPARISON:  Left knee radiographs 02/14/2007  FINDINGS: Soft tissue defect is seen laterally in the distal lower leg. No dissecting soft tissue emphysema seen. Bones are diffusely osteopenic. There is no evidence of fracture. Knee and ankle are approximated. No knee joint effusion is seen. There is diffuse reticulation of the fat throughout the visualized leg. Vascular calcifications are noted.  IMPRESSION: Soft tissue defect in the lateral, distal leg with diffuse soft tissue swelling. No dissecting soft tissue emphysema or acute osseous abnormality identified.   Electronically Signed   By: Sebastian Ache   On: 12/13/2013 13:36    EKG Interpretation   None       MDM   1. Cellulitis   2. Stage III pressure ulcer    Medications  sodium chloride 0.9 % bolus 500 mL (0 mLs Intravenous Stopped 12/13/13 1420)  doxycycline (VIBRA-TABS) tablet 100 mg (100 mg Oral Given 12/13/13 1627)   Filed Vitals:   12/13/13 1425 12/13/13 1500 12/13/13 1540 12/13/13 1627  BP:  139/58 131/63   Pulse:  70 70   Temp:   97.4 F (36.3 C) 97.4 F (36.3 C)  TempSrc:   Oral Oral  Resp:   18   Height: 5\' 5"  (1.651 m)     Weight: 135 lb (61.236 kg)     SpO2:  93% 96%     Patient presenting to emergency department with left leg infection that started on Monday and has gotten progressively worse. Nephew reported that the swelling, erythema, inflammation, and warmth has gotten progressively worse. Patient reported that there is an "uncomfortable" feeling in her leg that gets worse  with motion and applying pressure. Nephew reported an ulcer to the left lower extremity with that has been present since 09/2013 - reported that it gets cleaned at least 2-3 times per day. Reported that patient was seen by Dr. Despina Hick, orthopedics surgeon, on Tuesday 12/10/2013 where synovial fluid infection administered. Patient was started on Keflex, but stopped secondary to allergic reaction. Patient has history of Dementia and lives at a facility. Patient level 5 caveat. Alert. GCS 15. Heart rate and rhythm normal. Lungs clear to auscultation to upper lower lobes bilaterally. Radial pulses and DP pulses 2+ bilaterally. Swelling, erythema, inflammation noted to the left lower extremity-left tib-fib region-circumferential. Warmth upon palpation. Swelling and inflammation extends from dorsal aspect of left foot to just below the knee circumferentially. Pain upon palpation noted. Full range of motion noted to the  digits of the left foot. Decreased range of motion to left ankle secondary to pain. Full range of motion noted to the left knee and left hip without difficulty or ataxia. Approximately 2 cm x 2 cm stage III ulcer localized to the lateral aspect of the mid left tib-fib region-negative active drainage or bleeding noted, margins clear. Sensation intact. Cap refill less than 3 seconds. Lactic acid low-0.47. CBC negative elevation white blood cell count noted-negative leukocytosis. CMP negative findings noted. Plain film of left tib-fib noted soft tissue defect in the lateral distal leg with diffuse soft tissue swelling there is no dissecting soft tissue emphysema or acute osseous abnormality identify.  This provider and attending physician recommended patient to be admitted for IV antibiotics. This provider spoke with [facility]. Patient seen and assessed by Dr. Raymond Gurney, [facility] - who at this moment in time does not believe patient needs to be admitted to the hospital - recommended PO  antibiotics. Recommended doxycycline 100 mg BID x 10 days and follow-up with PCP within one week.  Doubt osteomyelitis. Doubt gangrene - negative crepitus upon palpation, negative emphysema noted to the plain film. Negative elevation of WBC and afebrile - patient is not septic at this time. Patient stable, afebrile. Discharged patient. Discharged patient with Doxycycline. Referred patient to orthopedics and PCP. Referred to wound center clinic. Discussed with patient to take medications as prescribed. This provider marked area of skin - discussed with patient that if the redness or swelling goes past this line of demarcation that she is to report back to the ED immediately. Discussed with nephew medication regimen and if swelling/erythema goes past line of demarcation patient is to report back to the ED. Discussed with patient to closely monitor symptoms and if symptoms are to worsen or change to report back to the ED - strict return instructions given.  Patient agreed to plan of care, understood, all questions answered.   Raymon Mutton, PA-C 12/14/13 1733

## 2013-12-13 NOTE — ED Notes (Signed)
2 IV attempts by [employer] unsuccessful. IV team paged.

## 2013-12-13 NOTE — ED Notes (Signed)
Pt has had 3 episodes in incontinence. Pt brief changed and pt repositioned. Pt reports "I have a leaking bladder."

## 2013-12-13 NOTE — ED Notes (Signed)
Janet BachCecil, nephew reports he will be here in an hour to take aunt/pt home.

## 2013-12-13 NOTE — Consult Note (Addendum)
[facility] Medical Consultation  Janet SkeansMartha W Patterson UJW:[id number]:[id number] DOB: 06/26/1924 DOA: 12/13/2013 PCP: No primary provider on file.   Requesting physician: Dr. Freida BusmanAllen Date of consultation: 12/13/13 Reason for consultation: RLE wound  Impression/Recommendations Active Problems: LLE wound    1. RLE wound - Patient with chronic wound for 2 or 3 months. Was recently given Keflex for ulcer on 12/12/2013. The family thought the patient was having allergic reaction and medication discontinued. - Patient is afebrile with normal white blood cell count and vital signs within normal limits. Lactic acid level reassuring. - At this point recommendations would be to try an oral antibiotic agent with activity against staph aureus infections as such would recommend oral doxycycline for 10-12 days.  - Would recommend continued followup at wound clinic. - If patient is having pain with ambulation would recommend DME wheelchair on discharge until condition improves. - Recommend reevaluation by primary care physician within the next week - Recommend doxycycline 100 mg twice a day  Addendum: patient to be evaluated as recommended above.  Chief Complaint: Left lower extremity ulcer, with pain on palpation  HPI:  Patient is an 80 year old Caucasian female presenting to the ED with a chronic left lower extremity ulcer. Reportedly patient was seen by her orthopedic surgeon yesterday 12/12/2013 and was prescribed Keflex. Keflex was reportedly discontinued due to perceived adverse reaction. Much of the history is obtained by the EMR as patient has dementia and is unable to provide history.   The patient was brought to the ED for further evaluation given left lower extremity ulcer. While in the emergency department patient had vital signs which were within normal limits. Was also found to be afebrile with a lactic acid level below normal values. We were consult at for further evaluation of left lower extremities  and further recommendations. While in the ED x-ray of left lower extremity was also obtained which showed no emphysema or acute osseous abnormality.  Review of Systems:  Unable to accurately assess due to dementia  Past Medical History  Diagnosis Date  . Dementia   . Hypertension   . Hyperlipemia   . Hypothyroidism   . GERD (gastroesophageal reflux disease)   . IBS (irritable bowel syndrome)   . Osteoarthritis    Past Surgical History  Procedure Laterality Date  . Abdominal hysterectomy     Social History:  reports that she has never smoked. She does not have any smokeless tobacco history on file. She reports that she does not drink alcohol or use illicit drugs.  Allergies  Allergen Reactions  . Keflex [Cephalexin] Hives   No family history on file.  Prior to Admission medications   Medication Sig Start Date End Date Taking? Authorizing Provider  escitalopram (LEXAPRO) 10 MG tablet Take 10 mg by mouth daily.   Yes Historical Provider, MD  lansoprazole (PREVACID) 30 MG capsule Take 30 mg by mouth daily at 12 noon.   Yes Historical Provider, MD  levothyroxine (SYNTHROID, LEVOTHROID) 75 MCG tablet Take 75 mcg by mouth daily before breakfast.   Yes Historical Provider, MD  rOPINIRole (REQUIP) 2 MG tablet Take 2 mg by mouth at bedtime.   Yes Historical Provider, MD  rosuvastatin (CRESTOR) 10 MG tablet Take 10 mg by mouth daily.   Yes Historical Provider, MD  torsemide (DEMADEX) 20 MG tablet Take 40 mg by mouth 2 (two) times daily.   Yes Historical Provider, MD  traMADol (ULTRAM) 50 MG tablet Take 50-100 mg by mouth every 6 (six) hours as needed  for moderate pain.   Yes Historical Provider, MD   Physical Exam: Blood pressure 139/58, pulse 70, temperature 97.4 F (36.3 C), temperature source Oral, resp. rate 16, height 5\' 5"  (1.651 m), weight 61.236 kg (135 lb), SpO2 93.00%. Filed Vitals:   12/13/13 1500  BP: 139/58  Pulse: 70  Temp:   Resp:      General:  Patient smiling,  alert, awake  Eyes: Extraocular movements intact, no icterus  ENT: Normal exterior appearance, moist mucous membranes  Neck: Supple, no goiter  Cardiovascular: Regular rate and rhythm, no murmurs rubs or gallops  Respiratory: Clear to auscultation, no increased work of breathing  Abdomen: Soft, nondistended, nontender  Skin: Left lower extremity dose for the surrounding cellulitis. No induration or purulent discharge  Musculoskeletal: No cyanosis or clubbing  Psychiatric: Patient pleasantly demented as such making exam difficult  Neurologic: No facial asymmetry, moves all extremities equally  Labs on Admission:  Basic Metabolic Panel:  Recent Labs Lab 12/13/13 1335  NA 142  K 3.9  CL 107  CO2 27  GLUCOSE 82  BUN 10  CREATININE 0.88  CALCIUM 9.1   Liver Function Tests:  Recent Labs Lab 12/13/13 1335  AST 15  ALT 9  ALKPHOS 68  BILITOT 0.3  PROT 6.7  ALBUMIN 2.7*   No results found for this basename: LIPASE, AMYLASE,  in the last 168 hours No results found for this basename: AMMONIA,  in the last 168 hours CBC:  Recent Labs Lab 12/13/13 1335  WBC 8.4  NEUTROABS 4.9  HGB 11.3*  HCT 34.4*  MCV 86.9  PLT 258   Cardiac Enzymes: No results found for this basename: CKTOTAL, CKMB, CKMBINDEX, TROPONINI,  in the last 168 hours BNP: No components found with this basename: POCBNP,  CBG: No results found for this basename: GLUCAP,  in the last 168 hours  Radiological Exams on Admission: Dg Tibia/fibula Left  12/13/2013   CLINICAL DATA:  Left lower leg redness, swelling, and pain for 1 month. Wound to the lateral lower leg.  EXAM: LEFT TIBIA AND FIBULA - 2 VIEW  COMPARISON:  Left knee radiographs 02/14/2007  FINDINGS: Soft tissue defect is seen laterally in the distal lower leg. No dissecting soft tissue emphysema seen. Bones are diffusely osteopenic. There is no evidence of fracture. Knee and ankle are approximated. No knee joint effusion is seen. There is  diffuse reticulation of the fat throughout the visualized leg. Vascular calcifications are noted.  IMPRESSION: Soft tissue defect in the lateral, distal leg with diffuse soft tissue swelling. No dissecting soft tissue emphysema or acute osseous abnormality identified.   Electronically Signed   By: Sebastian Ache   On: 12/13/2013 13:36    Time spent: > 50 minutes  Penny Pia [facility] Pager [phone]  If 7PM-7AM, please contact night-coverage [email or web address] Password [email or web address] 12/13/2013, 3:38 PM

## 2013-12-13 NOTE — ED Notes (Signed)
Small amount of bleeding around site. Pressure applied and site cleaned.

## 2013-12-13 NOTE — Discharge Instructions (Signed)
Please call your doctor for a followup appointment within 24-48 hours. When you talk to your doctor please let them know that you were seen in the emergency department and have them acquire all of your records so that they can discuss the findings with you and formulate a treatment plan to fully care for your new and ongoing problems. Please rest and stay hydrated Please take antibiotics as prescribed and take on a full stomach  Please closely monitor symptoms and if swelling, redness goes past the pen marker line please report back to the ED immediately Please call and set-up an appointment with Wound Clinic to be assessed within one week Please continue to monitor symptoms closely and if symptoms are to worsen or change (fever greater than 101, chills, neck pain, neck stiffness, chest pain, shortness of breath, difficulty breathing, swelling to the leg, draining of pus from the leg, nausea, vomiting, stomach pain, numbness, tingling, increased warmth to the leg) please report back to the ED immediately   Cellulitis Cellulitis is an infection of the skin and the tissue beneath it. The infected area is usually red and tender. Cellulitis occurs most often in the arms and lower legs.  CAUSES  Cellulitis is caused by bacteria that enter the skin through cracks or cuts in the skin. The most common types of bacteria that cause cellulitis are Staphylococcus and Streptococcus. SYMPTOMS   Redness and warmth.  Swelling.  Tenderness or pain.  Fever. DIAGNOSIS  Your caregiver can usually determine what is wrong based on a physical exam. Blood tests may also be done. TREATMENT  Treatment usually involves taking an antibiotic medicine. HOME CARE INSTRUCTIONS   Take your antibiotics as directed. Finish them even if you start to feel better.  Keep the infected arm or leg elevated to reduce swelling.  Apply a warm cloth to the affected area up to 4 times per day to relieve pain.  Only take  over-the-counter or prescription medicines for pain, discomfort, or fever as directed by your caregiver.  Keep all follow-up appointments as directed by your caregiver. SEEK MEDICAL CARE IF:   You notice red streaks coming from the infected area.  Your red area gets larger or turns dark in color.  Your bone or joint underneath the infected area becomes painful after the skin has healed.  Your infection returns in the same area or another area.  You notice a swollen bump in the infected area.  You develop new symptoms. SEEK IMMEDIATE MEDICAL CARE IF:   You have a fever.  You feel very sleepy.  You develop vomiting or diarrhea.  You have a general ill feeling (malaise) with muscle aches and pains. MAKE SURE YOU:   Understand these instructions.  Will watch your condition.  Will get help right away if you are not doing well or get worse. Document Released: 08/17/2005 Document Revised: 05/08/2012 Document Reviewed: 01/23/2012 [facility] Patient Information 2014 [place], [place].   Emergency Department Resource Guide 1) Find a Doctor and Pay Out of Pocket Although you won't have to find out who is covered by your insurance plan, it is a good idea to ask around and get recommendations. You will then need to call the office and see if the doctor you have chosen will accept you as a new patient and what types of options they offer for patients who are self-pay. Some doctors offer discounts or will set up payment plans for their patients who do not have insurance, but you will need to  ask so you aren't surprised when you get to your appointment.  2) Contact Your Local Health Department Not all health departments have doctors that can see patients for sick visits, but many do, so it is worth a call to see if yours does. If you don't know where your local health department is, you can check in your phone book. The CDC also has a tool to help you locate your state's health department, and  many state websites also have listings of all of their local health departments.  3) Find a Walk-in Clinic If your illness is not likely to be very severe or complicated, you may want to try a walk in clinic. These are popping up all over the country in pharmacies, drugstores, and shopping centers. They're usually staffed by nurse practitioners or physician assistants that have been trained to treat common illnesses and complaints. They're usually fairly quick and inexpensive. However, if you have serious medical issues or chronic medical problems, these are probably not your best option.  No Primary Care Doctor: - Call Health Connect at  [phone] - they can help you locate a primary care doctor that  accepts your insurance, provides certain services, etc. - Physician Referral Service- [phone]  Chronic Pain Problems: Organization         Address  Phone   Notes  [facility]  [phone]) [phone] Patients need to be referred by their primary care doctor.   Medication Assistance: Organization         Address  Phone   Notes  [facility] Medication [facility] [address] [place], [place] [address] [phone]) [phone] --Must be a resident of [facility] [place] -- Must have NO insurance coverage whatsoever (no Medicaid/ Medicare, etc.) -- The pt. MUST have a primary care doctor that directs their care regularly and follows them in the community   MedAssist  [phone]   [employer]  [phone]    Agencies that provide inexpensive medical care: Organization         Address  Phone   Notes  [facility]  [phone]   [facility]    [phone]   [facility] [address] [place], [place] [address] [phone]   [facility] [place] [address], [place] [phone]) [phone]   Planned Parenthood    [phone](336) [phone]   [facility]    [phone]   Community Health  and [facility]  [address], [place] Phone:  [phone], Fax:  [phone]) [phone] Hours of Operation:  9 am - 6 pm, M-F.  Also accepts Medicaid/Medicare and self-pay.  [facility]  [address], [place] Phone: [phone]) [phone], Fax: [phone]. Hours of Operation:  8:30 am - 5:30 pm, M-F.  Also accepts Medicaid and self-pay.  [facility] [place] [facility] [place] [address], [place] Phone: [phone]) [phone]   [facility] [address], [place], [place] [phone], Ext. [phone] Mondays & Thursdays: 7-9 AM.  First 15 patients are seen on a first come, first serve basis.    Medicaid-accepting [facility] Providers:  Organization         Address  Phone   Notes  [facility] [address], [place] [phone]) [phone] Also accepts self-pay patients.  [facility] [address], [address] [place]  [phone]   [facility] [address], [place] [phone]   [facility] [address], [place] [phone]   Renaye Rakers [address], [place]   [phone] Only accepts [place] patients after they have their name applied to their card.   Self-Pay (no insurance) in [facility]:  Organization         Address  Phone   Notes  Sickle Cell Patients, [facility] [address] [place], [place] [phone]   [facility] [address] [place], [place] [phone]   [facility]  [address], [place] [phone]   [facility]/Dr. Osei-Bonsu  [address] [place] or [address], [place] [phone] Phone number for both [place] and [place] locations is the same.  Urgent Medical and [facility] [address], [place] [phone]   [facility]  [address] [place] or [address] [phone]   [facility] [address], [place] [phone], phone; [phone], fax Sees patients 1st and 3rd Saturday of every month.  Must not qualify for public or private insurance (i.e. Medicaid, Medicare, [place] Health Choice, Veterans' Benefits)  Household income should be no more than 200% of the poverty level The clinic cannot treat you if you are pregnant or think you are pregnant  Sexually transmitted diseases are not treated at the clinic.    Dental Care: Organization         Address  Phone  Notes  [facility] [address] [place], [place] [phone] Accepts children up to age 7 who are enrolled in [place] or [place] Health Choice; pregnant women with a Medicaid card; and children who have applied for Medicaid or [place] Health Choice, but were declined, whose parents can pay a reduced fee at time of service.  [facility] of [facility]  [address], [place] [phone] Accepts children up to age 39 who are enrolled in [place] or [place] Health Choice; pregnant women with a Medicaid card; and children who have applied for Medicaid or  Health Choice, but were declined, whose parents can pay a reduced fee at time of service.  [facility]  [address] [place], [place] [phone] Patients are seen by appointment only. Walk-ins are not accepted. [facility] will see patients 62 years of age and older. Monday - Tuesday (8am-5pm) Most Wednesdays (8:30-5pm) $30 per visit, cash only  [facility] Adult Dental Access PROGRAM  [address], [facility] [phone] Patients are seen by appointment only. Walk-ins are not accepted. [facility] will see patients 26 years of age and older. One Wednesday Evening (Monthly: Volunteer Based).  $30 per visit, cash only   [employer]  [phone] for adults; Children under age 36, call Graduate Pediatric Dentistry at [phone]. Children aged 22-14, please call [phone] to request a pediatric application.  Dental services are provided in all areas of dental care including fillings, crowns and bridges, complete and partial dentures, implants, gum treatment, root canals, and extractions. Preventive care is also provided. Treatment is provided to both adults and children. Patients are selected via a lottery and there is often  a waiting list.   [facility] [address], [place]  [phone] [email or web address]   [facility] [address], [place], [place] [phone], Ext. [phone] Second and Fourth Thursday of each month, opens at 6:30 AM; Clinic ends at 9 AM.  Patients are seen on a first-come first-served basis, and a limited number are seen during each clinic.   [facility]  [address], [place], [place] [phone]) [phone]   Eligibility Requirements You must have lived in [place], [place], or [place] counties for at least the last three months.   You cannot be eligible for state or federal sponsored National Cityhealthcare insurance, including CIGNAVeterans Administration, [place]icaid, or [employer]Medicare.   You generally cannot be eligible for healthcare insurance through your employer.    How to apply: Eligibility screenings are held every Tuesday and Wednesday afternoon from 1:00 pm until 4:00 pm. You do not need an appointment for the interview!  [facility] [address], [place], [place] [phone]   [facility]  [phone]   [facility]  [phone]   [facility]  [phone]    Behavioral Health Resources in the Community: Intensive Outpatient Programs Organization         Address  Phone  Notes  [facility] [address], [place],  [place] [phone]   [facility] [address], [place], [place] [phone]   [facility] [address], [place], [place]  [phone]   [facility] [address],  [place], [place] [phone] or [phone]   Substance Abuse Resources Organization         Address  Phone  Notes  [facility]  [phone]   [facility]  [phone]   [facility]  [phone]   [facility]  [phone]   Residential & Outpatient Substance Abuse Program  [phone]   Psychological Services Organization         Address  Phone  Notes  [facility]  [phone]- [phone]   [facility]  [phone]- [phone]   [facility] [address], [place] [phone] or [phone]    Mobile Crisis Teams Organization         Address  Phone  Notes  [facility], Mobile Crisis Care Unit  [phone]   [facility]  [address] [place], [place] [phone]   Doristine LocksSharon DeEsch [address] [place] [phone]    Self-Help/Support Groups Organization         Address  Phone             Notes  [facility]ental [facility] Assoc. of [place] - variety of support groups  336- [phone] Call for more information  Narcotics Anonymous (NA), Caring Services [address], [place]  2 meetings at this location   Statisticianesidential Treatment Programs Organization         Address  Phone  Notes  [facility] [address],    [place]  [phone]   [facility]  [address], [place] [phone], [place], [place] [phone]   [facility] [address], [place] [phone] Admissions: 8am-3pm M-F  [facility] [address]    [place], [place] [phone]   [facility] [address], [place], [place] [phone]   [facility]  [facility] [address]  [place], [place] [phone]   [facility] - Intensive Outpatient [address], [place], [place] [phone]   [facility] [address] [place].,  [employer], [place] [phone] or [phone]   [facility] ([facility]) [address], [place], [place] [phone] Accepts Medicaid  [facility] [place] [address]  [place] [phone] Substance Abuse/Addiction Treatment   [facility] Organization         Address  Phone  Notes  [facility]  [phone]   Angie Fava, PhD [address]vin Knack [place], [place]   [phone])[phone] or [phone]   [facility] Behavioral   [address] [place], [place] [phone]   [facility] [address], [place], [place] [phone] Insurance/Medicaid/sponsorship through [facility] and Families [address]                                    [place], [place] [phone] Therapy/tele-psych/case  [facility] [address] [place], [place] [phone]    Dr. Lolly Mustache  [phone]   [facility]  [employer] [facility] Dept. 1) [address] [place] 2) [address] [place] 3)  [address], [place] [phone]  [phone]   [facility] Child Abuse Hotline 830-173-9863 or [phone] (After Hours)

## 2013-12-13 NOTE — ED Notes (Signed)
[name], nephew contact number [phone]

## 2013-12-13 NOTE — Progress Notes (Signed)
12/13/2013 A. Ilda Laskin RNCM 1754pm Wheelchair at patient's bedside in ED.

## 2013-12-15 NOTE — ED Provider Notes (Signed)
Medical screening examination/treatment/procedure(s) were performed by non-physician practitioner and as supervising physician I was immediately available for consultation/collaboration.   Nakaiya Beddow T Allure Greaser, MD 12/15/13 1113 

## 2013-12-19 LAB — CULTURE, BLOOD (ROUTINE X 2)
CULTURE: NO GROWTH
Culture: NO GROWTH

## 2014-01-09 ENCOUNTER — Emergency Department (HOSPITAL_COMMUNITY): Payer: Medicare Other

## 2014-01-09 ENCOUNTER — Encounter (HOSPITAL_COMMUNITY): Payer: Self-pay | Admitting: Emergency Medicine

## 2014-01-09 ENCOUNTER — Emergency Department (HOSPITAL_COMMUNITY)
Admission: EM | Admit: 2014-01-09 | Discharge: 2014-01-09 | Disposition: A | Discharge status: Home / Self Care | Payer: Medicare Other | Attending: Emergency Medicine | Admitting: Emergency Medicine

## 2014-01-09 DIAGNOSIS — E785 Hyperlipidemia, unspecified: Secondary | ICD-10-CM | POA: Insufficient documentation

## 2014-01-09 DIAGNOSIS — E039 Hypothyroidism, unspecified: Secondary | ICD-10-CM | POA: Insufficient documentation

## 2014-01-09 DIAGNOSIS — S79919A Unspecified injury of unspecified hip, initial encounter: Secondary | ICD-10-CM | POA: Insufficient documentation

## 2014-01-09 DIAGNOSIS — W19XXXA Unspecified fall, initial encounter: Secondary | ICD-10-CM

## 2014-01-09 DIAGNOSIS — Z79899 Other long term (current) drug therapy: Secondary | ICD-10-CM | POA: Insufficient documentation

## 2014-01-09 DIAGNOSIS — Y9389 Activity, other specified: Secondary | ICD-10-CM | POA: Insufficient documentation

## 2014-01-09 DIAGNOSIS — K219 Gastro-esophageal reflux disease without esophagitis: Secondary | ICD-10-CM | POA: Insufficient documentation

## 2014-01-09 DIAGNOSIS — M1990 Unspecified osteoarthritis, unspecified site: Secondary | ICD-10-CM | POA: Insufficient documentation

## 2014-01-09 DIAGNOSIS — F0390 Unspecified dementia without behavioral disturbance: Secondary | ICD-10-CM | POA: Insufficient documentation

## 2014-01-09 DIAGNOSIS — S79929A Unspecified injury of unspecified thigh, initial encounter: Principal | ICD-10-CM

## 2014-01-09 DIAGNOSIS — R296 Repeated falls: Secondary | ICD-10-CM | POA: Insufficient documentation

## 2014-01-09 DIAGNOSIS — I10 Essential (primary) hypertension: Secondary | ICD-10-CM | POA: Insufficient documentation

## 2014-01-09 DIAGNOSIS — Y9210 Unspecified residential institution as the place of occurrence of the external cause: Secondary | ICD-10-CM | POA: Insufficient documentation

## 2014-01-09 MED ORDER — TEMAZEPAM 7.5 MG PO CAPS: ORAL_CAPSULE | ORAL | Status: AC

## 2014-01-09 NOTE — ED Notes (Signed)
Pt will be discharged per Dr. Estell HarpinZammit and needs transport back, med [place]nec filled out.  [facility]

## 2014-01-09 NOTE — ED Notes (Signed)
[facility] and fell while trying to stand up. Complaining of bilateral hip pain. Denies LOC.

## 2014-01-09 NOTE — ED Notes (Addendum)
Pts nephew Christiana PellantCecil Isley [phone] He stepped out and can be reached on his cell if needed

## 2014-01-09 NOTE — ED Notes (Signed)
As nephew wants to try to let her make her wound care apt we changed her into new depend, oplaced her clothes on and got her up in wheelchair.  poa will take her to her apt.  Ambulance cancelled.  Blanket and double footies given due to cold

## 2014-01-09 NOTE — Discharge Instructions (Signed)
Continue the tramodol for pain and follow up next week if needed

## 2014-01-09 NOTE — ED Provider Notes (Signed)
CSN: [id number]     Arrival date & time 01/09/14  [id number] History   First MD Initiated Contact with Patient 01/09/14 [id number]     Chief Complaint  Patient presents with  . Fall  . Hip Pain     (Consider location/radiation/quality/duration/timing/severity/associated sxs/prior Treatment) Patient is a 80 y.o. female presenting with fall and hip pain. The history is provided by the patient (pt fell possible loc.  pt with pain in both hips).  Fall This is a new problem. The current episode started 1 to 2 hours ago. The problem occurs constantly. The problem has not changed since onset.Pertinent negatives include no chest pain, no abdominal pain and no headaches. Nothing aggravates the symptoms. Nothing relieves the symptoms.  Hip Pain Pertinent negatives include no chest pain, no abdominal pain and no headaches.    Past Medical History  Diagnosis Date  . Dementia   . Hypertension   . Hyperlipemia   . Hypothyroidism   . GERD (gastroesophageal reflux disease)   . IBS (irritable bowel syndrome)   . Osteoarthritis    Past Surgical History  Procedure Laterality Date  . Abdominal hysterectomy     History reviewed. No pertinent family history. History  Substance Use Topics  . Smoking status: Never Smoker   . Smokeless tobacco: Not on file  . Alcohol Use: No   OB History   Grav Para Term Preterm Abortions TAB SAB Ect Mult Living                 Review of Systems  Constitutional: Negative for appetite change and fatigue.  HENT: Negative for congestion, ear discharge and sinus pressure.   Eyes: Negative for discharge.  Respiratory: Negative for cough.   Cardiovascular: Negative for chest pain.  Gastrointestinal: Negative for abdominal pain and diarrhea.  Genitourinary: Negative for frequency and hematuria.  Musculoskeletal: Positive for arthralgias. Negative for back pain.  Skin: Negative for rash.  Neurological: Negative for seizures and headaches.  Psychiatric/Behavioral: Negative  for hallucinations.      Allergies  Keflex  Home Medications   Current Outpatient Rx  Name  Route  Sig  Dispense  Refill  . escitalopram (LEXAPRO) 10 MG tablet   Oral   Take 10 mg by mouth daily.         . lansoprazole (PREVACID) 30 MG capsule   Oral   Take 30 mg by mouth daily at 12 noon.         Marland Kitchen. levothyroxine (SYNTHROID, LEVOTHROID) 75 MCG tablet   Oral   Take 75 mcg by mouth daily before breakfast.         . rOPINIRole (REQUIP) 2 MG tablet   Oral   Take 2 mg by mouth at bedtime.         . rosuvastatin (CRESTOR) 10 MG tablet   Oral   Take 10 mg by mouth daily.         Marland Kitchen. torsemide (DEMADEX) 20 MG tablet   Oral   Take 40 mg by mouth 2 (two) times daily.         . traMADol (ULTRAM) 50 MG tablet   Oral   Take 50-100 mg by mouth every 6 (six) hours as needed for moderate pain.         Marland Kitchen. temazepam (RESTORIL) 7.5 MG capsule      Take one half of a pill at bed time for sleep   30 capsule   0    BP 163/66  Pulse 82  Temp(Src) 98.2 F (36.8 C) (Oral)  Resp 16  SpO2 99% Physical Exam  Constitutional: She is oriented to person, place, and time. She appears well-developed.  HENT:  Head: Normocephalic.  Eyes: Conjunctivae and EOM are normal. No scleral icterus.  Neck: Neck supple. No thyromegaly present.  Cardiovascular: Normal rate and regular rhythm.  Exam reveals no gallop and no friction rub.   No murmur heard. Pulmonary/Chest: No stridor. She has no wheezes. She has no rales. She exhibits no tenderness.  Abdominal: She exhibits no distension. There is no tenderness. There is no rebound.  Musculoskeletal: Normal range of motion. She exhibits no edema.  Mild pain in both hips.  Normal rom  Lymphadenopathy:    She has no cervical adenopathy.  Neurological: She is oriented to person, place, and time. She exhibits normal muscle tone. Coordination normal.  Skin: No rash noted. No erythema.  Psychiatric: She has a normal mood and affect. Her  behavior is normal.    ED Course  Procedures (including critical care time) Labs Review Labs Reviewed - No data to display Imaging Review Dg Hip Complete Left  01/09/2014   CLINICAL DATA:  Left hip discomfort status post fall  EXAM: LEFT HIP - COMPLETE 2+ VIEW  COMPARISON:  Pelvis and hips of May 11, 2007  FINDINGS: AP and frogleg lateral views of the left hip reveal the bones to be reasonably well mineralized for age. The hip joint space is preserved. There is no evidence of an acute fracture of the proximal femur nor of the acetabulum. The observed portions of the left hemipelvis appear normal.  IMPRESSION: There is no acute bony abnormality of the left hip.   Electronically Signed   By: David  [place]   On: 01/09/2014 08:13   Dg Hip Complete Right  01/09/2014   CLINICAL DATA:  Bilateral hip and leg discomfort status post fall  EXAM: RIGHT HIP - COMPLETE 2+ VIEW  COMPARISON:  Right hip series of May 11, 2007  FINDINGS: The patient has undergone previous ORIF for a subcapital fracture of the right hip with placement of 3 cortical screws through the intertrochanteric region and neck. No acute fracture of the proximal femur is demonstrated. The acetabulum appears intact. The bony pelvis elsewhere exhibits no acute abnormality. The left hip is grossly normal for age.  IMPRESSION: No acute fracture of the right hip nor bony pelvis is demonstrated.   Electronically Signed   By: David  [place]   On: 01/09/2014 08:11   Ct Head Wo Contrast  01/09/2014   CLINICAL DATA:  Fall.  EXAM: CT HEAD WITHOUT CONTRAST  CT CERVICAL SPINE WITHOUT CONTRAST  TECHNIQUE: Multidetector CT imaging of the head and cervical spine was performed following the standard protocol without intravenous contrast. Multiplanar CT image reconstructions of the cervical spine were also generated.  COMPARISON:  MR HEAD WO/W CM dated 06/29/2005  FINDINGS: CT HEAD FINDINGS  There is evidence of mild cortical atrophy and mild small vessel disease.  The brain demonstrates no evidence of hemorrhage, infarction, edema, mass effect, extra-axial fluid collection, hydrocephalus or mass lesion. The skull is unremarkable.  CT CERVICAL SPINE FINDINGS  The cervical spine shows normal alignment. There is no evidence of acute fracture or subluxation. No soft tissue swelling or hematoma is identified. Mild degenerative disc disease present at C5-6 and C6-7. Facet hypertrophy noted at multiple levels. No bony or soft tissue lesions are seen. The visualized airway is normally patent.  IMPRESSION: No acute findings. The brain demonstrates  mild atrophy and small vessel disease. Degenerative changes are present in the cervical spine.   Electronically Signed   By: Irish Lack M.D.   On: 01/09/2014 07:56   Ct Cervical Spine Wo Contrast  01/09/2014   CLINICAL DATA:  Fall.  EXAM: CT HEAD WITHOUT CONTRAST  CT CERVICAL SPINE WITHOUT CONTRAST  TECHNIQUE: Multidetector CT imaging of the head and cervical spine was performed following the standard protocol without intravenous contrast. Multiplanar CT image reconstructions of the cervical spine were also generated.  COMPARISON:  MR HEAD WO/W CM dated 06/29/2005  FINDINGS: CT HEAD FINDINGS  There is evidence of mild cortical atrophy and mild small vessel disease. The brain demonstrates no evidence of hemorrhage, infarction, edema, mass effect, extra-axial fluid collection, hydrocephalus or mass lesion. The skull is unremarkable.  CT CERVICAL SPINE FINDINGS  The cervical spine shows normal alignment. There is no evidence of acute fracture or subluxation. No soft tissue swelling or hematoma is identified. Mild degenerative disc disease present at C5-6 and C6-7. Facet hypertrophy noted at multiple levels. No bony or soft tissue lesions are seen. The visualized airway is normally patent.  IMPRESSION: No acute findings. The brain demonstrates mild atrophy and small vessel disease. Degenerative changes are present in the cervical spine.    Electronically Signed   By: Irish Lack M.D.   On: 01/09/2014 07:56    EKG Interpretation   None       MDM   Final diagnoses:  Fall    Fall with buttocks and hip contusion bilaterally    Benny Lennert, MD 01/09/14 [phone]

## 2015-11-30 ENCOUNTER — Encounter: Payer: Self-pay | Admitting: Cardiology

## 2016-04-12 ENCOUNTER — Emergency Department (HOSPITAL_COMMUNITY): Payer: Medicare Other

## 2016-04-12 ENCOUNTER — Inpatient Hospital Stay (HOSPITAL_COMMUNITY)
Admission: EM | Admit: 2016-04-12 | Discharge: 2016-04-14 | DRG: 482 | Disposition: A | Discharge status: Skilled Nursing Facility | Payer: Medicare Other | Source: Skilled Nursing Facility | Attending: Orthopedic Surgery | Admitting: Orthopedic Surgery

## 2016-04-12 ENCOUNTER — Encounter (HOSPITAL_COMMUNITY): Payer: Self-pay | Admitting: *Deleted

## 2016-04-12 DIAGNOSIS — Z881 Allergy status to other antibiotic agents status: Secondary | ICD-10-CM | POA: Diagnosis not present

## 2016-04-12 DIAGNOSIS — K219 Gastro-esophageal reflux disease without esophagitis: Secondary | ICD-10-CM | POA: Diagnosis present

## 2016-04-12 DIAGNOSIS — S72012A Unspecified intracapsular fracture of left femur, initial encounter for closed fracture: Secondary | ICD-10-CM | POA: Diagnosis present

## 2016-04-12 DIAGNOSIS — E785 Hyperlipidemia, unspecified: Secondary | ICD-10-CM | POA: Diagnosis present

## 2016-04-12 DIAGNOSIS — W19XXXA Unspecified fall, initial encounter: Secondary | ICD-10-CM | POA: Diagnosis present

## 2016-04-12 DIAGNOSIS — S72002A Fracture of unspecified part of neck of left femur, initial encounter for closed fracture: Secondary | ICD-10-CM | POA: Diagnosis not present

## 2016-04-12 DIAGNOSIS — Z880 Allergy status to penicillin: Secondary | ICD-10-CM | POA: Diagnosis not present

## 2016-04-12 DIAGNOSIS — Y92009 Unspecified place in unspecified non-institutional (private) residence as the place of occurrence of the external cause: Secondary | ICD-10-CM

## 2016-04-12 DIAGNOSIS — F329 Major depressive disorder, single episode, unspecified: Secondary | ICD-10-CM | POA: Diagnosis present

## 2016-04-12 DIAGNOSIS — F419 Anxiety disorder, unspecified: Secondary | ICD-10-CM | POA: Diagnosis present

## 2016-04-12 DIAGNOSIS — R413 Other amnesia: Secondary | ICD-10-CM | POA: Diagnosis present

## 2016-04-12 DIAGNOSIS — S7292XA Unspecified fracture of left femur, initial encounter for closed fracture: Secondary | ICD-10-CM | POA: Diagnosis present

## 2016-04-12 DIAGNOSIS — F0390 Unspecified dementia without behavioral disturbance: Secondary | ICD-10-CM | POA: Diagnosis present

## 2016-04-12 DIAGNOSIS — S72009A Fracture of unspecified part of neck of unspecified femur, initial encounter for closed fracture: Secondary | ICD-10-CM | POA: Diagnosis present

## 2016-04-12 DIAGNOSIS — I10 Essential (primary) hypertension: Secondary | ICD-10-CM | POA: Diagnosis present

## 2016-04-12 DIAGNOSIS — F32A Depression, unspecified: Secondary | ICD-10-CM | POA: Diagnosis present

## 2016-04-12 DIAGNOSIS — E039 Hypothyroidism, unspecified: Secondary | ICD-10-CM | POA: Diagnosis present

## 2016-04-12 LAB — HEPATIC FUNCTION PANEL
ALBUMIN: 3.2 g/dL — AB (ref 3.5–5.0)
ALK PHOS: 92 U/L (ref 38–126)
ALT: 35 U/L (ref 14–54)
AST: 33 U/L (ref 15–41)
Bilirubin, Direct: 0.1 mg/dL (ref 0.1–0.5)
Indirect Bilirubin: 0.3 mg/dL (ref 0.3–0.9)
Total Bilirubin: 0.4 mg/dL (ref 0.3–1.2)
Total Protein: 6.3 g/dL — ABNORMAL LOW (ref 6.5–8.1)

## 2016-04-12 LAB — CBC WITH DIFFERENTIAL/PLATELET
BASOS PCT: 0 %
Basophils Absolute: 0 10*3/uL (ref 0.0–0.1)
EOS PCT: 3 %
Eosinophils Absolute: 0.3 10*3/uL (ref 0.0–0.7)
HCT: 33.8 % — ABNORMAL LOW (ref 36.0–46.0)
HEMOGLOBIN: 10.7 g/dL — AB (ref 12.0–15.0)
Lymphocytes Relative: 29 %
Lymphs Abs: 2.9 10*3/uL (ref 0.7–4.0)
MCH: 29.1 pg (ref 26.0–34.0)
MCHC: 31.7 g/dL (ref 30.0–36.0)
MCV: 91.8 fL (ref 78.0–100.0)
Monocytes Absolute: 0.8 10*3/uL (ref 0.1–1.0)
Monocytes Relative: 8 %
NEUTROS ABS: 5.9 10*3/uL (ref 1.7–7.7)
NEUTROS PCT: 60 %
PLATELETS: 276 10*3/uL (ref 150–400)
RBC: 3.68 MIL/uL — AB (ref 3.87–5.11)
RDW: 14.9 % (ref 11.5–15.5)
WBC: 9.9 10*3/uL (ref 4.0–10.5)

## 2016-04-12 LAB — BASIC METABOLIC PANEL
Anion gap: 6 (ref 5–15)
BUN: 32 mg/dL — AB (ref 6–20)
CHLORIDE: 103 mmol/L (ref 101–111)
CO2: 29 mmol/L (ref 22–32)
Calcium: 8.8 mg/dL — ABNORMAL LOW (ref 8.9–10.3)
Creatinine, Ser: 0.97 mg/dL (ref 0.44–1.00)
GFR calc Af Amer: 58 mL/min — ABNORMAL LOW (ref >60)
GFR calc non Af Amer: 50 mL/min — ABNORMAL LOW (ref >60)
Glucose, Bld: 113 mg/dL — ABNORMAL HIGH (ref 65–99)
POTASSIUM: 3.7 mmol/L (ref 3.5–5.1)
Sodium: 138 mmol/L (ref 135–145)

## 2016-04-12 MED ORDER — POVIDONE-IODINE 10 % EX SWAB
2.0000 "application " | Freq: Once | CUTANEOUS | Status: AC
  Administered 2016-04-13: 2 via TOPICAL

## 2016-04-12 MED ORDER — CHLORHEXIDINE GLUCONATE 4 % EX LIQD
60.0000 mL | Freq: Once | CUTANEOUS | Status: AC
  Administered 2016-04-13: 4 via TOPICAL
  Filled 2016-04-12: qty 15

## 2016-04-12 MED ORDER — CLINDAMYCIN PHOSPHATE 900 MG/50ML IV SOLN
900.0000 mg | INTRAVENOUS | Status: AC
  Administered 2016-04-13: 600 mg via INTRAVENOUS
  Filled 2016-04-12 (×2): qty 50

## 2016-04-12 MED ORDER — HYDROMORPHONE HCL 1 MG/ML IJ SOLN
1.0000 mg | Freq: Once | INTRAMUSCULAR | Status: AC
  Administered 2016-04-12: 1 mg via INTRAVENOUS
  Filled 2016-04-12: qty 1

## 2016-04-12 MED ORDER — MORPHINE SULFATE (PF) 2 MG/ML IV SOLN: 2.0000 mg | INTRAVENOUS | Status: DC | PRN

## 2016-04-12 MED ORDER — ONDANSETRON HCL 4 MG/2ML IJ SOLN
4.0000 mg | Freq: Once | INTRAMUSCULAR | Status: AC
  Administered 2016-04-12: 4 mg via INTRAVENOUS
  Filled 2016-04-12: qty 2

## 2016-04-12 NOTE — ED Provider Notes (Signed)
CSN: [id number]     Arrival date & time 04/12/16  1954 History   First MD Initiated Contact with Patient 04/12/16 2008     Chief Complaint  Patient presents with  . Fall     (Consider location/radiation/quality/duration/timing/severity/associated sxs/prior Treatment) Patient is a 82 y.o. female presenting with fall. The history is provided by the patient (Patient fell at the rest home and complains of headache and left hip pain).  Fall This is a new problem. The current episode started 3 to 5 hours ago. The problem occurs constantly. The problem has been resolved. Pertinent negatives include no chest pain, no abdominal pain and no headaches. Exacerbated by: Movement of left hip. Nothing relieves the symptoms.    Past Medical History  Diagnosis Date  . Dementia   . Hypertension   . Hyperlipemia   . Hypothyroidism   . GERD (gastroesophageal reflux disease)   . IBS (irritable bowel syndrome)   . Osteoarthritis    Past Surgical History  Procedure Laterality Date  . Abdominal hysterectomy     History reviewed. No pertinent family history. Social History  Substance Use Topics  . Smoking status: Never Smoker   . Smokeless tobacco: None  . Alcohol Use: No   OB History    No data available     Review of Systems  Constitutional: Negative for appetite change and fatigue.  HENT: Negative for congestion, ear discharge and sinus pressure.        Headache  Eyes: Negative for discharge.  Respiratory: Negative for cough.   Cardiovascular: Negative for chest pain.  Gastrointestinal: Negative for abdominal pain and diarrhea.  Genitourinary: Negative for frequency and hematuria.  Musculoskeletal: Negative for back pain.       Left hip pain  Skin: Negative for rash.  Neurological: Negative for seizures and headaches.  Psychiatric/Behavioral: Negative for hallucinations.      Allergies  Keflex  Home Medications   Prior to Admission medications   Medication Sig Start Date End  Date Taking? Authorizing Provider  escitalopram (LEXAPRO) 10 MG tablet Take 10 mg by mouth daily.    Historical Provider, MD  lansoprazole (PREVACID) 30 MG capsule Take 30 mg by mouth daily at 12 noon.    Historical Provider, MD  levothyroxine (SYNTHROID, LEVOTHROID) 75 MCG tablet Take 75 mcg by mouth daily before breakfast.    Historical Provider, MD  rOPINIRole (REQUIP) 2 MG tablet Take 2 mg by mouth at bedtime.    Historical Provider, MD  rosuvastatin (CRESTOR) 10 MG tablet Take 10 mg by mouth daily.    Historical Provider, MD  temazepam (RESTORIL) 7.5 MG capsule Take one half of a pill at bed time for sleep 01/09/14   Bethann Berkshire, MD  torsemide (DEMADEX) 20 MG tablet Take 40 mg by mouth 2 (two) times daily.    Historical Provider, MD  traMADol (ULTRAM) 50 MG tablet Take 50-100 mg by mouth every 6 (six) hours as needed for moderate pain.    Historical Provider, MD   BP 130/79 mmHg  Pulse 84  Temp(Src) 98.6 F (37 C) (Oral)  Resp 18  Ht 5\' 4"  (1.626 m)  Wt 135 lb (61.236 kg)  BMI 23.16 kg/m2  SpO2 96% Physical Exam  Constitutional: She is oriented to person, place, and time. She appears well-developed.  HENT:  Head: Normocephalic.  Mild tenderness to left occipital head. Neck not tender  Eyes: Conjunctivae and EOM are normal. No scleral icterus.  Neck: Neck supple. No thyromegaly present.  Cardiovascular:  Normal rate and regular rhythm.  Exam reveals no gallop and no friction rub.   No murmur heard. Pulmonary/Chest: No stridor. She has no wheezes. She has no rales. She exhibits no tenderness.  Abdominal: She exhibits no distension. There is no tenderness. There is no rebound.  Musculoskeletal: She exhibits no edema.  Tenderness to left hip  Lymphadenopathy:    She has no cervical adenopathy.  Neurological: She is oriented to person, place, and time. She exhibits normal muscle tone. Coordination normal.  Skin: No rash noted. No erythema.  Psychiatric: She has a normal mood and  affect. Her behavior is normal.    ED Course  Procedures (including critical care time) Labs Review Labs Reviewed  CBC WITH DIFFERENTIAL/PLATELET - Abnormal; Notable for the following:    RBC 3.68 (*)    Hemoglobin 10.7 (*)    HCT 33.8 (*)    All other components within normal limits  HEPATIC FUNCTION PANEL  BASIC METABOLIC PANEL  TYPE AND SCREEN    Imaging Review Ct Head Wo Contrast  04/12/2016  CLINICAL DATA:  Status post fall, hitting back of head on floor. Initial encounter. EXAM: CT HEAD WITHOUT CONTRAST CT CERVICAL SPINE WITHOUT CONTRAST TECHNIQUE: Multidetector CT imaging of the head and cervical spine was performed following the standard protocol without intravenous contrast. Multiplanar CT image reconstructions of the cervical spine were also generated. COMPARISON:  CT of the head performed 11/12/2015, and CT of the cervical spine performed 12/21/2014 FINDINGS: CT HEAD FINDINGS There is no evidence of acute infarction, mass lesion, or intra- or extra-axial hemorrhage on CT. Prominence of ventricles and sulci reflects mild cortical volume loss. Cerebellar atrophy is noted. Scattered periventricular and subcortical white matter change likely reflects small vessel ischemic microangiopathy. The brainstem and fourth ventricle are within normal limits. The basal ganglia are unremarkable in appearance. The cerebral hemispheres demonstrate grossly normal gray-white differentiation. No mass effect or midline shift is seen. There is no evidence of fracture; visualized osseous structures are unremarkable in appearance. The visualized portions of the orbits are within normal limits. The paranasal sinuses and mastoid air cells are well-aerated. No significant soft tissue abnormalities are seen. CT CERVICAL SPINE FINDINGS There is no evidence of fracture or subluxation. Vertebral bodies demonstrate normal height. There is mild grade 1 anterolisthesis of C5 on C6 and of C7 on T1, reflecting underlying  facet disease. Mild disc space narrowing is noted at C5-C6. Prevertebral soft tissues are within normal limits. The thyroid gland is unremarkable in appearance. Minimal scarring is noted at the lung apices. Prominent calcification is seen at the carotid bifurcations bilaterally. IMPRESSION: 1. No evidence of traumatic intracranial injury or fracture. 2. No evidence of fracture or subluxation along the cervical spine. 3. Mild cortical volume loss and scattered small vessel ischemic microangiopathy. 4. Mild degenerative change along the lower cervical spine. 5. Minimal scarring at the lung apices. 6. Prominent calcification at the carotid bifurcations bilaterally. Carotid ultrasound would be helpful for further evaluation, when and as deemed clinically appropriate. Electronically Signed   By: Roanna RaiderJeffery  Chang M.D.   On: 04/12/2016 21:18   Ct Cervical Spine Wo Contrast  04/12/2016  CLINICAL DATA:  Status post fall, hitting back of head on floor. Initial encounter. EXAM: CT HEAD WITHOUT CONTRAST CT CERVICAL SPINE WITHOUT CONTRAST TECHNIQUE: Multidetector CT imaging of the head and cervical spine was performed following the standard protocol without intravenous contrast. Multiplanar CT image reconstructions of the cervical spine were also generated. COMPARISON:  CT of the  head performed 11/12/2015, and CT of the cervical spine performed 12/21/2014 FINDINGS: CT HEAD FINDINGS There is no evidence of acute infarction, mass lesion, or intra- or extra-axial hemorrhage on CT. Prominence of ventricles and sulci reflects mild cortical volume loss. Cerebellar atrophy is noted. Scattered periventricular and subcortical white matter change likely reflects small vessel ischemic microangiopathy. The brainstem and fourth ventricle are within normal limits. The basal ganglia are unremarkable in appearance. The cerebral hemispheres demonstrate grossly normal gray-white differentiation. No mass effect or midline shift is seen. There is  no evidence of fracture; visualized osseous structures are unremarkable in appearance. The visualized portions of the orbits are within normal limits. The paranasal sinuses and mastoid air cells are well-aerated. No significant soft tissue abnormalities are seen. CT CERVICAL SPINE FINDINGS There is no evidence of fracture or subluxation. Vertebral bodies demonstrate normal height. There is mild grade 1 anterolisthesis of C5 on C6 and of C7 on T1, reflecting underlying facet disease. Mild disc space narrowing is noted at C5-C6. Prevertebral soft tissues are within normal limits. The thyroid gland is unremarkable in appearance. Minimal scarring is noted at the lung apices. Prominent calcification is seen at the carotid bifurcations bilaterally. IMPRESSION: 1. No evidence of traumatic intracranial injury or fracture. 2. No evidence of fracture or subluxation along the cervical spine. 3. Mild cortical volume loss and scattered small vessel ischemic microangiopathy. 4. Mild degenerative change along the lower cervical spine. 5. Minimal scarring at the lung apices. 6. Prominent calcification at the carotid bifurcations bilaterally. Carotid ultrasound would be helpful for further evaluation, when and as deemed clinically appropriate. Electronically Signed   By: Roanna Raider M.D.   On: 04/12/2016 21:18   Dg Hip Unilat With Pelvis 2-3 Views Left  04/12/2016  CLINICAL DATA:  Fall today with left hip pain, initial encounter EXAM: DG HIP (WITH OR WITHOUT PELVIS) 2-3V LEFT COMPARISON:  03/30/2016 FINDINGS: Pelvic ring is intact. Postsurgical changes are seen within the proximal right femur. There is new subcapital femoral neck fracture with impaction at the fracture site on the left. No gross soft tissue abnormality is seen. No other focal abnormality is noted. IMPRESSION: Left subcapital femoral neck fracture with impaction at the fracture site. Electronically Signed   By: Alcide Clever M.D.   On: 04/12/2016 21:00   I  have personally reviewed and evaluated these images and lab results as part of my medical decision-making.   EKG Interpretation None      MDM   Final diagnoses:  Fall, initial encounter    Patient with a fractured left hip. CT of head neck normal. She will be admitted by medicine and Dr. Romeo Apple orthopedics will see patient in the a.m.    Bethann Berkshire, MD 04/12/16 2152

## 2016-04-12 NOTE — ED Notes (Signed)
Call to

## 2016-04-12 NOTE — ED Notes (Signed)
Attempt to call report.

## 2016-04-12 NOTE — H&P (Signed)
[facility] History and Physical  [name] FUX:[id number] DOB: 08/21/1924 DOA: 04/12/2016  Referring physician: Dr Roderic Palau PCP: Shirline Frees, MD   Chief Complaint: Fall w L hip fract  HPI: Janet Patterson is a 82 y.o. female presented from [place] in [place], said she was trying to go to the bathroom and fell.  Had pain in L hip.  Xrays showed a subcapital impacted fracture of the head of the femur.  CT of head and neck were normal.  Asked to see for hosp admission for L hip fracture.    Patient is a vague historian, not sure where we are but knows the year.  Pain in left hip primary concern.  Denies any recent CP, SOB, cough , leg swelling.  NO hx of heart disease or CHF.  Grew up in [facility], went to [employer]..   Was married, no kids, her husband has died.  No etoh / tobacco ( "I wish i did now !").     Chart review:  R hip fracture sp ORIF yrs ago  ROS  denies CP  no joint pain   no HA  no blurry vision  no rash  no diarrhea  no nausea/ vomiting  no dysuria  no difficulty voiding  no change in urine color     Past Medical History  Past Medical History  Diagnosis Date  . Dementia   . Hypertension   . Hyperlipemia   . Hypothyroidism   . GERD (gastroesophageal reflux disease)   . IBS (irritable bowel syndrome)   . Osteoarthritis    Past Surgical History  Past Surgical History  Procedure Laterality Date  . Abdominal hysterectomy     Family History History reviewed. No pertinent family history. Social History  reports that she has never smoked. She does not have any smokeless tobacco history on file. She reports that she does not drink alcohol or use illicit drugs. Allergies  Allergies  Allergen Reactions  . Keflex [Cephalexin] Hives   Home medications Prior to Admission medications   Medication Sig Start Date End Date Taking? Authorizing Provider  escitalopram (LEXAPRO) 10 MG tablet Take 10 mg by mouth daily.    Historical Provider, MD   lansoprazole (PREVACID) 30 MG capsule Take 30 mg by mouth daily at 12 noon.    Historical Provider, MD  levothyroxine (SYNTHROID, LEVOTHROID) 75 MCG tablet Take 75 mcg by mouth daily before breakfast.    Historical Provider, MD  rOPINIRole (REQUIP) 2 MG tablet Take 2 mg by mouth at bedtime.    Historical Provider, MD  rosuvastatin (CRESTOR) 10 MG tablet Take 10 mg by mouth daily.    Historical Provider, MD  temazepam (RESTORIL) 7.5 MG capsule Take one half of a pill at bed time for sleep 01/09/14   Milton Ferguson, MD  torsemide (DEMADEX) 20 MG tablet Take 40 mg by mouth 2 (two) times daily.    Historical Provider, MD  traMADol (ULTRAM) 50 MG tablet Take 50-100 mg by mouth every 6 (six) hours as needed for moderate pain.    Historical Provider, MD   Liver Function Tests  Recent Labs Lab 04/12/16 2045  AST 33  ALT 35  ALKPHOS 92  BILITOT 0.4  PROT 6.3*  ALBUMIN 3.2*   No results for input(s): LIPASE, AMYLASE in the last 168 hours. CBC  Recent Labs Lab 04/12/16 2045  WBC 9.9  NEUTROABS 5.9  HGB 10.7*  HCT 33.8*  MCV 91.8  PLT 276   Basic  Metabolic Panel  Recent Labs Lab 04/12/16 2045  NA 138  K 3.7  CL 103  CO2 29  GLUCOSE 113*  BUN 32*  CREATININE 0.97  CALCIUM 8.8*     Filed Vitals:   04/12/16 2200 04/12/16 2230 04/12/16 2310 04/12/16 2311  BP: 135/79 132/68 113/66   Pulse: 82 77 88   Temp:   97.8 F (36.6 C)   TempSrc:   Oral   Resp:  16 18   Height:   _0  (1.626 m)   Weight:   51.982 kg (114 lb 9.6 oz)   SpO2: 100% 98% 89% 97%   Exam: Gen frail elderly female, in no distress No rash, cyanosis or gangrene Sclera anicteric, throat clear  No jvd or bruits Chest clear bilat RRR no MRG Abd soft ntnd no mass or ascites +bs GU defer MS no joint effusions or deformity Ext trace -1+ edema bilat / no wounds or ulcers Neuro is alert, orient to person and 2017, not too place   EKG (independently reviewed) > NSR, no acute changes L hip xray  (independently reviewed) > Left subcapital femoral neck fracture with impaction at the fracture site CT head and CT neck > no fracture or trauma  Home medications:  Lexapro, requip, restoril, ultram Demadex, crestor, synthroid, prevacid   Na 138  K 3.7  Creat 0.97  BUN 32  Alb 3.2  eGFR 50  LFT"s ok WBC 9k  Hb 10.7   plt 276   Assessment: 1. Fall/ L hip fracture , subcapital - ortho is aware that pt is here.  Admit to med-surg, IV MSO4 prn, get pain under control. IVF"s.  No vol excess on exam.   2. Depression /anxiety - cont meds from home 3. HL 4. Hypothyroid 5. Poor memory - ?dementia  Plan - as above    Sol Blazing [facility] Pager [phone]  Cell [phone]  If 7PM-7AM, please contact night-coverage [email or web address] Password [facility] 04/12/2016, 11:31 PM

## 2016-04-12 NOTE — ED Notes (Signed)
O2Sat decreased ? Due to pain meds- O2 2L  with return of normal O2 sat

## 2016-04-12 NOTE — ED Notes (Signed)
Call from [facility]- POC Silver Hugueninndrea Carter- [phone]- [phone] Informed of patient admission She states that pt's nephew Lillia DallasCecil Issac has been informed of her fall and transport to the hospital. She also states that she will inform Mr Orpah Clintonssac of pt's admission

## 2016-04-12 NOTE — ED Notes (Signed)
Pt reports that her leg gave out and she fell and hit the floor with no LOC. She now states that her left hip and her head hurts 5/10

## 2016-04-12 NOTE — ED Notes (Signed)
[place] from [facility] [place]; pt states she was trying to go to bathroom and fell; pt has pain to left hip and bump to back of head

## 2016-04-12 NOTE — ED Notes (Signed)
Pt reports that her leg just went out from under her and she fell- reports hip pain and head pain- She reports she hit her head but did not lose consciousness. Her pants are cut off, [facility], labs are drawn

## 2016-04-12 NOTE — ED Notes (Signed)
Physician in to reassess 

## 2016-04-13 ENCOUNTER — Inpatient Hospital Stay (HOSPITAL_COMMUNITY): Payer: Medicare Other | Admitting: Anesthesiology

## 2016-04-13 ENCOUNTER — Encounter (HOSPITAL_COMMUNITY)
Admission: EM | Disposition: A | Discharge status: Skilled Nursing Facility | Payer: Self-pay | Source: Skilled Nursing Facility | Attending: Orthopedic Surgery

## 2016-04-13 ENCOUNTER — Encounter (HOSPITAL_COMMUNITY): Payer: Self-pay | Admitting: *Deleted

## 2016-04-13 ENCOUNTER — Inpatient Hospital Stay (HOSPITAL_COMMUNITY): Payer: Medicare Other

## 2016-04-13 DIAGNOSIS — S72002A Fracture of unspecified part of neck of left femur, initial encounter for closed fracture: Secondary | ICD-10-CM | POA: Diagnosis present

## 2016-04-13 DIAGNOSIS — W19XXXA Unspecified fall, initial encounter: Secondary | ICD-10-CM

## 2016-04-13 DIAGNOSIS — R413 Other amnesia: Secondary | ICD-10-CM

## 2016-04-13 HISTORY — PX: HIP PINNING,CANNULATED: SHX1758

## 2016-04-13 LAB — BASIC METABOLIC PANEL
ANION GAP: 5 (ref 5–15)
BUN: 27 mg/dL — ABNORMAL HIGH (ref 6–20)
CALCIUM: 8.5 mg/dL — AB (ref 8.9–10.3)
CO2: 29 mmol/L (ref 22–32)
Chloride: 103 mmol/L (ref 101–111)
Creatinine, Ser: 0.91 mg/dL (ref 0.44–1.00)
GFR calc non Af Amer: 54 mL/min — ABNORMAL LOW (ref >60)
Glucose, Bld: 107 mg/dL — ABNORMAL HIGH (ref 65–99)
POTASSIUM: 4.2 mmol/L (ref 3.5–5.1)
Sodium: 137 mmol/L (ref 135–145)

## 2016-04-13 LAB — CBC
HEMATOCRIT: 34.5 % — AB (ref 36.0–46.0)
HEMOGLOBIN: 10.8 g/dL — AB (ref 12.0–15.0)
MCH: 28.6 pg (ref 26.0–34.0)
MCHC: 31.3 g/dL (ref 30.0–36.0)
MCV: 91.3 fL (ref 78.0–100.0)
Platelets: 255 10*3/uL (ref 150–400)
RBC: 3.78 MIL/uL — AB (ref 3.87–5.11)
RDW: 15 % (ref 11.5–15.5)
WBC: 14.3 10*3/uL — AB (ref 4.0–10.5)

## 2016-04-13 LAB — SURGICAL PCR SCREEN
MRSA, PCR: POSITIVE — AB
Staphylococcus aureus: POSITIVE — AB

## 2016-04-13 LAB — ABO/RH: ABO/RH(D): A POS

## 2016-04-13 LAB — PREPARE RBC (CROSSMATCH)

## 2016-04-13 LAB — HEMOGLOBIN AND HEMATOCRIT, BLOOD
HCT: 34.3 % — ABNORMAL LOW (ref 36.0–46.0)
Hemoglobin: 10.9 g/dL — ABNORMAL LOW (ref 12.0–15.0)

## 2016-04-13 SURGERY — FIXATION, FEMUR, NECK, PERCUTANEOUS, USING SCREW
Anesthesia: Spinal | Site: Hip | Laterality: Left

## 2016-04-13 MED ORDER — EPHEDRINE SULFATE 50 MG/ML IJ SOLN
INTRAMUSCULAR | Status: AC
  Filled 2016-04-13: qty 1

## 2016-04-13 MED ORDER — HYDROCODONE-ACETAMINOPHEN 5-325 MG PO TABS: 1.0000 | ORAL_TABLET | Freq: Four times a day (QID) | ORAL | Status: DC | PRN

## 2016-04-13 MED ORDER — MUPIROCIN 2 % EX OINT
1.0000 "application " | TOPICAL_OINTMENT | Freq: Two times a day (BID) | CUTANEOUS | Status: DC
  Administered 2016-04-13: 1 via NASAL

## 2016-04-13 MED ORDER — LIDOCAINE HCL (PF) 1 % IJ SOLN
INTRAMUSCULAR | Status: AC
  Filled 2016-04-13: qty 5

## 2016-04-13 MED ORDER — SODIUM CHLORIDE 0.9 % IV SOLN
Freq: Once | INTRAVENOUS | Status: AC
  Administered 2016-04-13: 11:00:00 via INTRAVENOUS

## 2016-04-13 MED ORDER — ROPINIROLE HCL 1 MG PO TABS
2.0000 mg | ORAL_TABLET | Freq: Every day | ORAL | Status: DC
  Administered 2016-04-13: 2 mg via ORAL
  Filled 2016-04-13: qty 2

## 2016-04-13 MED ORDER — FENTANYL CITRATE (PF) 100 MCG/2ML IJ SOLN
INTRAMUSCULAR | Status: AC
  Filled 2016-04-13: qty 2

## 2016-04-13 MED ORDER — PROPOFOL 10 MG/ML IV BOLUS
INTRAVENOUS | Status: DC | PRN
  Administered 2016-04-13: 7 mg via INTRAVENOUS

## 2016-04-13 MED ORDER — HEPARIN SODIUM (PORCINE) 5000 UNIT/ML IJ SOLN: 5000.0000 [IU] | Freq: Three times a day (TID) | INTRAMUSCULAR | Status: DC

## 2016-04-13 MED ORDER — EPINEPHRINE HCL 0.1 MG/ML IJ SOSY
PREFILLED_SYRINGE | INTRAMUSCULAR | Status: DC | PRN
  Administered 2016-04-13: .1 mL via INTRAVENOUS

## 2016-04-13 MED ORDER — ONDANSETRON HCL 4 MG/2ML IJ SOLN: 4.0000 mg | Freq: Once | INTRAMUSCULAR | Status: DC | PRN

## 2016-04-13 MED ORDER — ROSUVASTATIN CALCIUM 10 MG PO TABS
10.0000 mg | ORAL_TABLET | Freq: Every day | ORAL | Status: DC
  Administered 2016-04-14: 10 mg via ORAL
  Filled 2016-04-13 (×2): qty 1

## 2016-04-13 MED ORDER — FENTANYL CITRATE (PF) 100 MCG/2ML IJ SOLN
25.0000 ug | INTRAMUSCULAR | Status: DC
  Administered 2016-04-13: 25 ug via INTRAVENOUS

## 2016-04-13 MED ORDER — ASPIRIN EC 325 MG PO TBEC
325.0000 mg | DELAYED_RELEASE_TABLET | Freq: Two times a day (BID) | ORAL | Status: DC
  Administered 2016-04-14: 325 mg via ORAL
  Filled 2016-04-13: qty 1

## 2016-04-13 MED ORDER — MIDAZOLAM HCL 2 MG/2ML IJ SOLN
1.0000 mg | INTRAMUSCULAR | Status: DC | PRN
  Administered 2016-04-13: 2 mg via INTRAVENOUS

## 2016-04-13 MED ORDER — LEVOTHYROXINE SODIUM 75 MCG PO TABS
75.0000 ug | ORAL_TABLET | Freq: Every day | ORAL | Status: DC
  Administered 2016-04-14: 75 ug via ORAL
  Filled 2016-04-13: qty 1

## 2016-04-13 MED ORDER — MUPIROCIN 2 % EX OINT
TOPICAL_OINTMENT | CUTANEOUS | Status: AC
  Filled 2016-04-13: qty 22

## 2016-04-13 MED ORDER — FENTANYL CITRATE (PF) 100 MCG/2ML IJ SOLN
INTRAMUSCULAR | Status: DC | PRN
  Administered 2016-04-13: 20 ug via INTRATHECAL

## 2016-04-13 MED ORDER — SODIUM CHLORIDE 0.9 % IV SOLN
INTRAVENOUS | Status: DC
  Administered 2016-04-13 – 2016-04-14 (×2): via INTRAVENOUS

## 2016-04-13 MED ORDER — ONDANSETRON HCL 4 MG/2ML IJ SOLN: 4.0000 mg | Freq: Four times a day (QID) | INTRAMUSCULAR | Status: DC | PRN

## 2016-04-13 MED ORDER — TRAMADOL HCL 50 MG PO TABS
50.0000 mg | ORAL_TABLET | Freq: Four times a day (QID) | ORAL | Status: DC
  Administered 2016-04-13 – 2016-04-14 (×4): 50 mg via ORAL
  Filled 2016-04-13 (×4): qty 1

## 2016-04-13 MED ORDER — PROPOFOL 500 MG/50ML IV EMUL
INTRAVENOUS | Status: DC | PRN
  Administered 2016-04-13: 25 ug/kg/min via INTRAVENOUS

## 2016-04-13 MED ORDER — MIDAZOLAM HCL 2 MG/2ML IJ SOLN
INTRAMUSCULAR | Status: AC
  Filled 2016-04-13: qty 2

## 2016-04-13 MED ORDER — PANTOPRAZOLE SODIUM 40 MG PO TBEC
40.0000 mg | DELAYED_RELEASE_TABLET | Freq: Every day | ORAL | Status: DC
  Administered 2016-04-14: 40 mg via ORAL
  Filled 2016-04-13 (×2): qty 1

## 2016-04-13 MED ORDER — FENTANYL CITRATE (PF) 100 MCG/2ML IJ SOLN: 25.0000 ug | INTRAMUSCULAR | Status: DC | PRN

## 2016-04-13 MED ORDER — BUPIVACAINE IN DEXTROSE 0.75-8.25 % IT SOLN
INTRATHECAL | Status: DC | PRN
  Administered 2016-04-13: 2 mL via INTRATHECAL

## 2016-04-13 MED ORDER — EPHEDRINE SULFATE 50 MG/ML IJ SOLN
INTRAMUSCULAR | Status: DC | PRN
  Administered 2016-04-13: 10 mg via INTRAVENOUS
  Administered 2016-04-13: 5 mg via INTRAVENOUS
  Administered 2016-04-13: 10 mg via INTRAVENOUS

## 2016-04-13 MED ORDER — PROPOFOL 10 MG/ML IV BOLUS
INTRAVENOUS | Status: AC
  Filled 2016-04-13: qty 20

## 2016-04-13 MED ORDER — SODIUM CHLORIDE 0.9 % IV SOLN
INTRAVENOUS | Status: DC | PRN
  Administered 2016-04-13: 11:00:00 via INTRAVENOUS

## 2016-04-13 MED ORDER — BUPIVACAINE IN DEXTROSE 0.75-8.25 % IT SOLN
INTRATHECAL | Status: AC
  Filled 2016-04-13: qty 2

## 2016-04-13 MED ORDER — SODIUM CHLORIDE 0.9 % IJ SOLN
INTRAMUSCULAR | Status: AC
  Filled 2016-04-13: qty 10

## 2016-04-13 MED ORDER — CHLORHEXIDINE GLUCONATE CLOTH 2 % EX PADS
6.0000 | MEDICATED_PAD | Freq: Every day | CUTANEOUS | Status: DC
  Administered 2016-04-13 – 2016-04-14 (×2): 6 via TOPICAL

## 2016-04-13 MED ORDER — BUPIVACAINE-EPINEPHRINE (PF) 0.5% -1:200000 IJ SOLN
INTRAMUSCULAR | Status: DC | PRN
  Administered 2016-04-13: 60 mL

## 2016-04-13 MED ORDER — ESCITALOPRAM OXALATE 10 MG PO TABS
10.0000 mg | ORAL_TABLET | Freq: Every day | ORAL | Status: DC
  Administered 2016-04-14: 10 mg via ORAL
  Filled 2016-04-13 (×2): qty 1

## 2016-04-13 MED ORDER — CETYLPYRIDINIUM CHLORIDE 0.05 % MT LIQD
7.0000 mL | Freq: Two times a day (BID) | OROMUCOSAL | Status: DC
  Administered 2016-04-13 – 2016-04-14 (×2): 7 mL via OROMUCOSAL

## 2016-04-13 MED ORDER — PANTOPRAZOLE SODIUM 20 MG PO TBEC
20.0000 mg | DELAYED_RELEASE_TABLET | Freq: Every day | ORAL | Status: DC
  Filled 2016-04-13: qty 1

## 2016-04-13 MED ORDER — SODIUM CHLORIDE 0.9 % IR SOLN
Status: DC | PRN
  Administered 2016-04-13: 1000 mL

## 2016-04-13 MED ORDER — MORPHINE SULFATE (PF) 2 MG/ML IV SOLN
1.0000 mg | INTRAVENOUS | Status: DC | PRN
  Administered 2016-04-13: 2 mg via INTRAVENOUS
  Filled 2016-04-13: qty 1

## 2016-04-13 MED ORDER — ACETAMINOPHEN 500 MG PO TABS: 500.0000 mg | ORAL_TABLET | ORAL | Status: DC | PRN

## 2016-04-13 MED ORDER — BUPIVACAINE-EPINEPHRINE (PF) 0.5% -1:200000 IJ SOLN
INTRAMUSCULAR | Status: AC
  Filled 2016-04-13: qty 60

## 2016-04-13 MED ORDER — DOCUSATE SODIUM 100 MG PO CAPS
100.0000 mg | ORAL_CAPSULE | Freq: Two times a day (BID) | ORAL | Status: DC
  Administered 2016-04-13 – 2016-04-14 (×2): 100 mg via ORAL
  Filled 2016-04-13 (×2): qty 1

## 2016-04-13 MED ORDER — MIDAZOLAM HCL 5 MG/5ML IJ SOLN
INTRAMUSCULAR | Status: DC | PRN
  Administered 2016-04-13: 1 mg via INTRAVENOUS

## 2016-04-13 MED ORDER — POLYETHYLENE GLYCOL 3350 17 G PO PACK: 17.0000 g | PACK | Freq: Every day | ORAL | Status: DC | PRN

## 2016-04-13 SURGICAL SUPPLY — 50 items
BAG HAMPER (MISCELLANEOUS) ×3 IMPLANT
BIT DRILL 5 ACE CANN QC (BIT) ×3 IMPLANT
BLADE HEX COATED 2.75 (ELECTRODE) ×3 IMPLANT
BNDG GAUZE ELAST 4 BULKY (GAUZE/BANDAGES/DRESSINGS) ×3 IMPLANT
CHLORAPREP W/TINT 26ML (MISCELLANEOUS) ×3 IMPLANT
CLOTH BEACON ORANGE TIMEOUT ST (SAFETY) ×3 IMPLANT
COVER LIGHT HANDLE STERIS (MISCELLANEOUS) ×12 IMPLANT
COVER MAYO STAND XLG (DRAPE) IMPLANT
DECANTER SPIKE VIAL GLASS SM (MISCELLANEOUS) ×6 IMPLANT
DRAPE STERI IOBAN 125X83 (DRAPES) ×3 IMPLANT
DRESSING MEPILEX BORDER 6X8 (GAUZE/BANDAGES/DRESSINGS) ×1 IMPLANT
DRSG AQUACEL AG ADV 3.5X10 (GAUZE/BANDAGES/DRESSINGS) IMPLANT
DRSG MEPILEX BORDER 6X8 (GAUZE/BANDAGES/DRESSINGS) ×3
DRSG TEGADERM 4X4.75 (GAUZE/BANDAGES/DRESSINGS) IMPLANT
GLOVE BIOGEL PI IND STRL 7.0 (GLOVE) ×1 IMPLANT
GLOVE BIOGEL PI INDICATOR 7.0 (GLOVE) ×2
GLOVE SKINSENSE NS SZ8.0 LF (GLOVE) ×2
GLOVE SKINSENSE STRL SZ8.0 LF (GLOVE) ×1 IMPLANT
GLOVE SS N UNI LF 8.5 STRL (GLOVE) ×3 IMPLANT
GOWN STRL REUS W/TWL LRG LVL3 (GOWN DISPOSABLE) ×12 IMPLANT
GOWN STRL REUS W/TWL XL LVL3 (GOWN DISPOSABLE) ×3 IMPLANT
INST SET MAJOR BONE (KITS) ×3 IMPLANT
KIT BLADEGUARD II DBL (SET/KITS/TRAYS/PACK) ×3 IMPLANT
KIT ROOM TURNOVER APOR (KITS) ×3 IMPLANT
MANIFOLD NEPTUNE II (INSTRUMENTS) ×3 IMPLANT
MARKER SKIN DUAL TIP RULER LAB (MISCELLANEOUS) ×3 IMPLANT
NEEDLE HYPO 21X1.5 SAFETY (NEEDLE) ×3 IMPLANT
NEEDLE SPNL 18GX3.5 QUINCKE PK (NEEDLE) ×3 IMPLANT
NS IRRIG 1000ML POUR BTL (IV SOLUTION) ×3 IMPLANT
PACK BASIC III (CUSTOM PROCEDURE TRAY) ×2
PACK SRG BSC III STRL LF ECLPS (CUSTOM PROCEDURE TRAY) ×1 IMPLANT
PAD ABD 5X9 TENDERSORB (GAUZE/BANDAGES/DRESSINGS) IMPLANT
PENCIL HANDSWITCHING (ELECTRODE) ×3 IMPLANT
PIN THREADED GUIDE ACE (PIN) ×15 IMPLANT
SCREW CANN 6.5 65MM (Screw) ×3 IMPLANT
SCREW CANN 6.5 70MM (Screw) ×3 IMPLANT
SCREW CANN 6.5 75MM (Screw) ×2 IMPLANT
SCREW CANN LG 6.5 FLT 70X22 (Screw) ×1 IMPLANT
SCREW CANN LG 6.5 FLT 75X22 (Screw) ×1 IMPLANT
SET BASIN LINEN APH (SET/KITS/TRAYS/PACK) ×3 IMPLANT
SPONGE LAP 18X18 X RAY DECT (DISPOSABLE) ×3 IMPLANT
STAPLER VISISTAT 35W (STAPLE) ×3 IMPLANT
SUT BRALON NAB BRD #1 30IN (SUTURE) ×3 IMPLANT
SUT MNCRL 0 VIOLET CTX 36 (SUTURE) ×1 IMPLANT
SUT MON AB 2-0 CT1 36 (SUTURE) ×3 IMPLANT
SUT MONOCRYL 0 CTX 36 (SUTURE) ×2
SYR 30ML LL (SYRINGE) ×3 IMPLANT
SYR BULB IRRIGATION 50ML (SYRINGE) ×6 IMPLANT
TOWEL OR 17X26 4PK STRL BLUE (TOWEL DISPOSABLE) ×3 IMPLANT
YANKAUER SUCT BULB TIP 10FT TU (MISCELLANEOUS) ×3 IMPLANT

## 2016-04-13 NOTE — Clinical Social Work Note (Signed)
CSW received referral for SNF. Pt in surgery. CSW will follow up when pt is available.   Derenda FennelKara Cedrik Heindl, LCSW [phone]

## 2016-04-13 NOTE — Op Note (Signed)
04/12/2016 - 04/13/2016  1:28 PM  PATIENT:  Janet Patterson  82 y.o. female  PRE-OPERATIVE DIAGNOSIS:  LEFT FEMORAL NECK FRACTURE  POST-OPERATIVE DIAGNOSIS:  LEFT FEMORAL NECK FRACTURE  PROCEDURE:  Procedure(s): CANNULATED HIP PINNING (Left)  Operative findings subcapital left femoral neck fracture with valgus impaction  Implants cannulated screws 3 titanium screws.  Depew  Details of procedure   The site was marked in the preop area the patient's identity was confirmed. Chart review was completed  Patient taken to surgery for spinal anesthetic. She was given clindamycin secondary to penicillin allergy  After successful spinal anesthesia the patient was placed supine on the fracture table.   We brought the C-arm in and positioned it so that we could get good lateral and AP x-rays. We then prepped sterilely draped sterilely and took a timeout. Implants were in the room x-rays were op site marking was confirmed as surgical site confirmed patient identity confirmed.  We made an incision at the greater trochanter extended slightly proximally and somewhat distally divided the subcutaneous taste tissue down to the fascia. We split this fascia in line with the skin incision and then divided the vastus lateralis. We used an elevator to subperiosteally exposed the proximal femur. We then placed 3 pins into the femoral head and confirm their position on AP and lateral x-ray  We measured each pin drill the cortex then placed screws over the pins and then hand tightened them. We checked their position on AP and lateral x-ray. This was acceptable and we irrigated and closed in layered fashion with 0 Monocryl and 1 Bralon  Skin staples were used to reapproximate the skin edges and we used 60 mL of Marcaine with epinephrine in the wound for postoperative anesthesia  Asst. [name]  SURGEON:  Surgeon(s) and Role:    * Roan Sawchuk E Mathilde Mcwherter, MD - Primary  PHYSICIAN ASSISTANT:   ANESTHESIA:    spinal  EBL:  Total I/O In: -  Out: 650 [Urine:550; Blood:100]  BLOOD ADMINISTERED:none  DRAINS: none     SPECIMEN:  No Specimen  DISPOSITION OF SPECIMEN:  N/A  COUNTS:  YES  TOURNIQUET:  * No tourniquets in log *  DICTATION: .Dragon Dictation  PLAN OF CARE: Admit to inpatient   PATIENT DISPOSITION:  PACU - hemodynamically stable.   Delay start of Pharmacological VTE agent (>24hrs) due to surgical blood loss or risk of bleeding: yes  

## 2016-04-13 NOTE — NC FL2 (Deleted)
[place] MEDICAID FL2 LEVEL OF CARE SCREENING TOOL     IDENTIFICATION  Patient Name: Janet Patterson Birthdate: 01/20/1924 Sex: female Admission Date (Current Location): 04/12/2016  [facility] and [place] Number:  [employer] and Address:  [facility],  [address] [place] [address]      Provider Number: [id number]  Attending Physician Name and Address:  Jerald Kief, MD  Relative Name and Phone Number:       Current Level of Care: Hospital Recommended Level of Care: Skilled Nursing Facility Prior Approval Number:    Date Approved/Denied:   PASRR Number: [id number]  Discharge Plan: SNF    Current Diagnoses: Patient Active Problem List   Diagnosis Date Noted  . Fall 04/12/2016  . Fracture of femur, subcapital, left, closed (HCC) 04/12/2016  . Depression 04/12/2016  . Memory difficulties 04/12/2016  . Hip fracture (HCC) 04/12/2016  . Cellulitis 12/13/2013    Orientation RESPIRATION BLADDER Height & Weight     Self, Place  O2 (2L) Incontinent Weight: 114 lb 9.6 oz (51.982 kg) Height:   (162.6 cm)  BEHAVIORAL SYMPTOMS/MOOD NEUROLOGICAL BOWEL NUTRITION STATUS  Other (Comment) (n/a)  (n/a) Incontinent Diet (NPO time specified. See d/c summary for updates)  AMBULATORY STATUS COMMUNICATION OF NEEDS Skin   Extensive Assist Verbally Surgical wounds                       Personal Care Assistance Level of Assistance  Bathing, Feeding, Dressing Bathing Assistance: Maximum assistance Feeding assistance: Limited assistance Dressing Assistance: Maximum assistance     Functional Limitations Info  Sight, Hearing, Speech Sight Info: Adequate Hearing Info: Adequate Speech Info: Adequate    SPECIAL CARE FACTORS FREQUENCY  PT (By licensed PT)     PT Frequency: 5              Contractures Contractures Info: Not present    Additional Factors Info  Code Status, Allergies, Isolation Precautions Code Status Info: Full  code Allergies Info: Keflex     Isolation Precautions Info: Contact precautions     Current Medications (04/13/2016):  This is the current hospital active medication list Current Facility-Administered Medications  Medication Dose Route Frequency Provider Last Rate Last Dose  . antiseptic oral rinse (CPC / CETYLPYRIDINIUM CHLORIDE 0.05%) solution 7 mL  7 mL Mouth Rinse BID Delano Metz, MD      . Chlorhexidine Gluconate Cloth 2 % PADS 6 each  6 each Topical Q0600 Delano Metz, MD      . clindamycin (CLEOCIN) IVPB 900 mg  900 mg Intravenous On Call to OR Vickki Hearing, MD      . docusate sodium (COLACE) capsule 100 mg  100 mg Oral BID Delano Metz, MD   100 mg at 04/13/16 0100  . escitalopram (LEXAPRO) tablet 10 mg  10 mg Oral Daily Delano Metz, MD      . heparin injection 5,000 Units  5,000 Units Subcutaneous Q8H Delano Metz, MD   5,000 Units at 04/13/16 0100  . HYDROcodone-acetaminophen (NORCO/VICODIN) 5-325 MG per tablet 1-2 tablet  1-2 tablet Oral Q6H PRN Delano Metz, MD      . levothyroxine (SYNTHROID, LEVOTHROID) tablet 75 mcg  75 mcg Oral QAC breakfast Delano Metz, MD      . morphine 2 MG/ML injection 1-2 mg  1-2 mg Intravenous Q3H PRN Delano Metz, MD      . mupirocin ointment (BACTROBAN) 2 % 1 application  1 application Nasal  BID Delano Metzobert Schertz, MD      . pantoprazole (PROTONIX) EC tablet 40 mg  40 mg Oral Daily Jerald KiefStephen K Chiu, MD      . polyethylene glycol (MIRALAX / GLYCOLAX) packet 17 g  17 g Oral Daily PRN Delano Metzobert Schertz, MD      . povidone-iodine 10 % swab 2 application  2 application Topical Once Vickki HearingStanley E Harrison, MD      . rOPINIRole (REQUIP) tablet 2 mg  2 mg Oral QHS Delano Metzobert Schertz, MD   2 mg at 04/13/16 0100  . rosuvastatin (CRESTOR) tablet 10 mg  10 mg Oral Daily Delano Metzobert Schertz, MD         Discharge Medications: Please see discharge summary for a list of discharge medications.  Relevant Imaging Results:  Relevant Lab  Results:   Additional Information SS#: [id number]  Karn CassisStultz, Jakira Mcfadden [place], [place] [phone]

## 2016-04-13 NOTE — Brief Op Note (Signed)
04/12/2016 - 04/13/2016  1:28 PM  PATIENT:  Janet Patterson  82 y.o. female  PRE-OPERATIVE DIAGNOSIS:  LEFT FEMORAL NECK FRACTURE  POST-OPERATIVE DIAGNOSIS:  LEFT FEMORAL NECK FRACTURE  PROCEDURE:  Procedure(s): CANNULATED HIP PINNING (Left)  Operative findings subcapital left femoral neck fracture with valgus impaction  Implants cannulated screws 3 titanium screws.  Depew  Details of procedure   The site was marked in the preop area the patient's identity was confirmed. Chart review was completed  Patient taken to surgery for spinal anesthetic. She was given clindamycin secondary to penicillin allergy  After successful spinal anesthesia the patient was placed supine on the fracture table.   We brought the C-arm in and positioned it so that we could get good lateral and AP x-rays. We then prepped sterilely draped sterilely and took a timeout. Implants were in the room x-rays were op site marking was confirmed as surgical site confirmed patient identity confirmed.  We made an incision at the greater trochanter extended slightly proximally and somewhat distally divided the subcutaneous taste tissue down to the fascia. We split this fascia in line with the skin incision and then divided the vastus lateralis. We used an elevator to subperiosteally exposed the proximal femur. We then placed 3 pins into the femoral head and confirm their position on AP and lateral x-ray  We measured each pin drill the cortex then placed screws over the pins and then hand tightened them. We checked their position on AP and lateral x-ray. This was acceptable and we irrigated and closed in layered fashion with 0 Monocryl and 1 Bralon  Skin staples were used to reapproximate the skin edges and we used 60 mL of Marcaine with epinephrine in the wound for postoperative anesthesia  Asst. [place] NationBetty Ashley  SURGEON:  Surgeon(s) and Role:    * Vickki HearingStanley E Annjanette Wertenberger, MD - Primary  PHYSICIAN ASSISTANT:   ANESTHESIA:    spinal  EBL:  Total I/O In: -  Out: 650 [Urine:550; Blood:100]  BLOOD ADMINISTERED:none  DRAINS: none     SPECIMEN:  No Specimen  DISPOSITION OF SPECIMEN:  N/A  COUNTS:  YES  TOURNIQUET:  * No tourniquets in log *  DICTATION: .Dragon Dictation  PLAN OF CARE: Admit to inpatient   PATIENT DISPOSITION:  PACU - hemodynamically stable.   Delay start of Pharmacological VTE agent (>24hrs) due to surgical blood loss or risk of bleeding: yes

## 2016-04-13 NOTE — Addendum Note (Signed)
Addendum  created 04/13/16 1402 by Moshe SalisburyKaren E Eliaz Fout, CRNA   Modules edited: Charges VN

## 2016-04-13 NOTE — Anesthesia Procedure Notes (Addendum)
Spinal Patient location during procedure: OR Start time: 04/13/2016 12:07 PM Staffing Anesthesiologist: Laurene FootmanGONZALEZ, LUIS Resident/CRNA: Glynn OctaveANIEL, Evens Meno E Preanesthetic Checklist Completed: patient identified, site marked, surgical consent, pre-op evaluation, timeout performed, IV checked, risks and benefits discussed and monitors and equipment checked Spinal Block Patient position: left lateral decubitus Prep: Betadine Patient monitoring: heart rate, cardiac monitor, continuous pulse ox and blood pressure Approach: left paramedian Location: L3-4 Injection technique: single-shot Needle Needle type: Spinocan  Needle gauge: 22 G Needle length: 9 cm Assessment Sensory level: T8 Additional Notes  ATTEMPTS:1 TRAY VW:[id number] TRAY EXPIRATION DATE:05/2016

## 2016-04-13 NOTE — Anesthesia Postprocedure Evaluation (Signed)
Anesthesia Post Note  Patient: Janet Patterson  Procedure(s) Performed: Procedure(s) (LRB): CANNULATED HIP PINNING (Left)  Patient location during evaluation: PACU Anesthesia Type: General Level of consciousness: awake, awake and alert, oriented and patient cooperative Pain management: pain level controlled Vital Signs Assessment: post-procedure vital signs reviewed and stable Respiratory status: spontaneous breathing, nonlabored ventilation and respiratory function stable Cardiovascular status: blood pressure returned to baseline and stable Anesthetic complications: no    Last Vitals:  Filed Vitals:   04/13/16 1140 04/13/16 1336  BP: 105/72   Pulse:    Temp:  36.4 C  Resp: 13     Last Pain:  Filed Vitals:   04/13/16 1340  PainSc: 7                  [name]

## 2016-04-13 NOTE — Consult Note (Signed)
Reason for Consult: left hip fracture  Referring Physician: Dr Lauretta Grillhiu  Janet Patterson is an 82 y.o. female.  HPI: 82 year old female, history of a right hip fracture with cannulated screw fixation fell fractured her left hip. Complains of left hip pain severe nonradiating 1 day. Lives alone has a nephew who appears to take care of her finances and medical decision making.    Past Medical History  Diagnosis Date  . Dementia   . Hypertension   . Hyperlipemia   . Hypothyroidism   . GERD (gastroesophageal reflux disease)   . IBS (irritable bowel syndrome)   . Osteoarthritis     Past Surgical History  Procedure Laterality Date  . Abdominal hysterectomy      History reviewed. No pertinent family history. Records indicate no pertinent family history. The patient could not give a history based on her dementia and confusion. As far as we know there is no history of anesthetic risks or bleeding problems.  Social History:  reports that she has never smoked. She does not have any smokeless tobacco history on file. She reports that she does not drink alcohol or use illicit drugs.  Allergies:  Allergies  Allergen Reactions  . Keflex [Cephalexin] Hives    Medications: I have reviewed the patient's current medications. BMP Latest Ref Rng 04/13/2016 04/12/2016 12/13/2013  Glucose 65 - 99 mg/dL 045(W107(H) 098(J113(H) 82  BUN 6 - 20 mg/dL 19(J27(H) 47(W32(H) 10  Creatinine 0.44 - 1.00 mg/dL 2.950.91 6.210.97 3.080.88  Sodium 135 - 145 mmol/L 137 138 142  Potassium 3.5 - 5.1 mmol/L 4.2 3.7 3.9  Chloride 101 - 111 mmol/L 103 103 107  CO2 22 - 32 mmol/L 29 29 27   Calcium 8.9 - 10.3 mg/dL 6.5(H8.5(L) 8.4(O8.8(L) 9.1    CBC Latest Ref Rng 04/13/2016 04/12/2016 12/13/2013  WBC 4.0 - 10.5 K/uL 14.3(H) 9.9 8.4  Hemoglobin 12.0 - 15.0 g/dL 10.8(L) 10.7(L) 11.3(L)  Hematocrit 36.0 - 46.0 % 34.5(L) 33.8(L) 34.4(L)  Platelets 150 - 400 K/uL 255 276 258     Ct Head Wo Contrast  04/12/2016  CLINICAL DATA:  Status post fall, hitting back  of head on floor. Initial encounter. EXAM: CT HEAD WITHOUT CONTRAST CT CERVICAL SPINE WITHOUT CONTRAST TECHNIQUE: Multidetector CT imaging of the head and cervical spine was performed following the standard protocol without intravenous contrast. Multiplanar CT image reconstructions of the cervical spine were also generated. COMPARISON:  CT of the head performed 11/12/2015, and CT of the cervical spine performed 12/21/2014 FINDINGS: CT HEAD FINDINGS There is no evidence of acute infarction, mass lesion, or intra- or extra-axial hemorrhage on CT. Prominence of ventricles and sulci reflects mild cortical volume loss. Cerebellar atrophy is noted. Scattered periventricular and subcortical white matter change likely reflects small vessel ischemic microangiopathy. The brainstem and fourth ventricle are within normal limits. The basal ganglia are unremarkable in appearance. The cerebral hemispheres demonstrate grossly normal gray-white differentiation. No mass effect or midline shift is seen. There is no evidence of fracture; visualized osseous structures are unremarkable in appearance. The visualized portions of the orbits are within normal limits. The paranasal sinuses and mastoid air cells are well-aerated. No significant soft tissue abnormalities are seen. CT CERVICAL SPINE FINDINGS There is no evidence of fracture or subluxation. Vertebral bodies demonstrate normal height. There is mild grade 1 anterolisthesis of C5 on C6 and of C7 on T1, reflecting underlying facet disease. Mild disc space narrowing is noted at C5-C6. Prevertebral soft tissues are within normal limits. The thyroid  gland is unremarkable in appearance. Minimal scarring is noted at the lung apices. Prominent calcification is seen at the carotid bifurcations bilaterally. IMPRESSION: 1. No evidence of traumatic intracranial injury or fracture. 2. No evidence of fracture or subluxation along the cervical spine. 3. Mild cortical volume loss and scattered  small vessel ischemic microangiopathy. 4. Mild degenerative change along the lower cervical spine. 5. Minimal scarring at the lung apices. 6. Prominent calcification at the carotid bifurcations bilaterally. Carotid ultrasound would be helpful for further evaluation, when and as deemed clinically appropriate. Electronically Signed   By: Roanna Raider M.D.   On: 04/12/2016 21:18   Ct Cervical Spine Wo Contrast  04/12/2016  CLINICAL DATA:  Status post fall, hitting back of head on floor. Initial encounter. EXAM: CT HEAD WITHOUT CONTRAST CT CERVICAL SPINE WITHOUT CONTRAST TECHNIQUE: Multidetector CT imaging of the head and cervical spine was performed following the standard protocol without intravenous contrast. Multiplanar CT image reconstructions of the cervical spine were also generated. COMPARISON:  CT of the head performed 11/12/2015, and CT of the cervical spine performed 12/21/2014 FINDINGS: CT HEAD FINDINGS There is no evidence of acute infarction, mass lesion, or intra- or extra-axial hemorrhage on CT. Prominence of ventricles and sulci reflects mild cortical volume loss. Cerebellar atrophy is noted. Scattered periventricular and subcortical white matter change likely reflects small vessel ischemic microangiopathy. The brainstem and fourth ventricle are within normal limits. The basal ganglia are unremarkable in appearance. The cerebral hemispheres demonstrate grossly normal gray-white differentiation. No mass effect or midline shift is seen. There is no evidence of fracture; visualized osseous structures are unremarkable in appearance. The visualized portions of the orbits are within normal limits. The paranasal sinuses and mastoid air cells are well-aerated. No significant soft tissue abnormalities are seen. CT CERVICAL SPINE FINDINGS There is no evidence of fracture or subluxation. Vertebral bodies demonstrate normal height. There is mild grade 1 anterolisthesis of C5 on C6 and of C7 on T1, reflecting  underlying facet disease. Mild disc space narrowing is noted at C5-C6. Prevertebral soft tissues are within normal limits. The thyroid gland is unremarkable in appearance. Minimal scarring is noted at the lung apices. Prominent calcification is seen at the carotid bifurcations bilaterally. IMPRESSION: 1. No evidence of traumatic intracranial injury or fracture. 2. No evidence of fracture or subluxation along the cervical spine. 3. Mild cortical volume loss and scattered small vessel ischemic microangiopathy. 4. Mild degenerative change along the lower cervical spine. 5. Minimal scarring at the lung apices. 6. Prominent calcification at the carotid bifurcations bilaterally. Carotid ultrasound would be helpful for further evaluation, when and as deemed clinically appropriate. Electronically Signed   By: Roanna Raider M.D.   On: 04/12/2016 21:18   Chest Portable 1 View  04/13/2016  CLINICAL DATA:  Left hip fracture. EXAM: PORTABLE CHEST 1 VIEW COMPARISON:  Radiographs and CT 02/07/2016 FINDINGS: Previous nodular opacities in the right lung have near completely resolved, with minimal residual scarring about the right upper lobe. No new consolidation. Heart size and mediastinal contours are unchanged. Minimal vascular congestion, no alveolar edema. No pleural effusion or pneumothorax. Chronic superior subluxation of the right humeral head. IMPRESSION: Mild vascular congestion, and no alveolar edema. Minimal right upper lobe scarring. Electronically Signed   By: Rubye Oaks M.D.   On: 04/13/2016 00:57   Dg Hip Unilat With Pelvis 2-3 Views Left  04/12/2016  CLINICAL DATA:  Fall today with left hip pain, initial encounter EXAM: DG HIP (WITH OR WITHOUT PELVIS) 2-3V  LEFT COMPARISON:  03/30/2016 FINDINGS: Pelvic ring is intact. Postsurgical changes are seen within the proximal right femur. There is new subcapital femoral neck fracture with impaction at the fracture site on the left. No gross soft tissue abnormality  is seen. No other focal abnormality is noted. IMPRESSION: Left subcapital femoral neck fracture with impaction at the fracture site. Electronically Signed   By: Alcide Clever M.D.   On: 04/12/2016 21:00    Review of Systems  Unable to perform ROS: dementia   Blood pressure 116/50, pulse 78, temperature 98.8 F (37.1 C), temperature source Oral, resp. rate 17, height 5\' 4"  (1.626 m), weight 114 lb 9.6 oz (51.982 kg), SpO2 99 %. Physical Exam  Constitutional: She is oriented to person, place, and time. She appears well-developed and well-nourished. No distress.  Body habitus ectomorphic  Patient confined to the bed can't walk secondary to left hip fracture  HENT:  Head: Normocephalic and atraumatic.  Eyes: Conjunctivae are normal. Pupils are equal, round, and reactive to light. Right eye exhibits no discharge. Left eye exhibits no discharge. No scleral icterus.  Neck: Normal range of motion. Neck supple. No JVD present. No tracheal deviation present. No thyromegaly present.  Cardiovascular: Normal rate and intact distal pulses.   Respiratory: Effort normal. No stridor. No respiratory distress. She has no wheezes. She exhibits no tenderness.  GI: Soft. She exhibits no distension. There is no tenderness. There is no rebound and no guarding.  Lymphadenopathy:    She has no cervical adenopathy.  Neurological: She is alert and oriented to person, place, and time. She displays normal reflexes. A cranial nerve deficit is present. She exhibits normal muscle tone. Coordination normal.  Skin: Skin is warm and dry. No rash noted. She is not diaphoretic. No erythema. No pallor.  Psychiatric: She has a normal mood and affect. Her behavior is normal.   Right upper extremity: Range of motion stability and strength tests are normal appearance normal normal alignment no tenderness. Normal sensation Left upper extremity normal alignment range of motion is normal stability tests were normal strength tests were  normal no tenderness. Normal sensation Right lower extremity appearance normal no tenderness normal alignment, range of motion full. Strength tests grade 5 motor strength. No contracture subluxation atrophy or tremor was noted. Normal sensation  Left lower extremity leg lengths remain equal tenderness in the left proximal thigh no swelling. Hip knee and ankle range of motion remains normal with painful hip range of motion past 90. All joints are reduced without subluxation. Ligaments were stable and the knee ankle. Muscle tone was normal. No atrophy was noted. Normal sensation  Pulses were normal in the radial artery on the right and left wrist and dorsalis pedis in the right and left leg    Assessment/Plan:  Plain films show internal fixation right hip with 3 cannulated screws in a subcapital fracture left hip.  Left subcapital impacted femoral neck fracture  Recommend internal fixation with 3 cannulated screws left hip  Risks of surgery will have to be explained to the nephew and consent will have to be obtained through him.  Fuller Canada 04/13/2016, 7:21 AM

## 2016-04-13 NOTE — Progress Notes (Signed)
PROGRESS NOTE    Janet Patterson  ZOX:[id number] DOB: April 27, 1924 DOA: 04/12/2016 PCP: Johny Blamer, MD   Brief Narrative:  82 y.o. female presented from [facility] in [place], said she was trying to go to the bathroom and fell. Had pain in L hip. Xrays showed a subcapital impacted fracture of the head of the femur  Assessment & Plan:   Principal Problem:   Fracture of femur, subcapital, left, closed (HCC) Active Problems:   Fall   Depression   Memory difficulties   Hip fracture (HCC)   Femoral neck fracture, left, closed, initial encounter   1. Fall/ L hip fracture , subcapital 1. Orthopedic surgery was consulted. 2. Patient underwentpinning of the left femoral neck fracture on 04/13/2016 3. PT/OT as tolerated 2. Depression /anxiety 1. Cont home meds as tolerated 3. HL 1. Continue statin per home regimen 4. Hypothyroid 1. Will continue levothyroxine per home regimen 5. Poor memory  1. Suspect questionable dementia   DVT prophylaxis: Heparin subcutaneous Code Status: Full Family Communication: Patient in room Disposition Plan: Anticipate likely skilled nursing facility, timing uncertain   Consultants:   Orthopedic surgery  Procedures:   Pinning of left femoral neck fracture on 04/13/2016  Antimicrobials:  Antibiotics Given (last 72 hours)    Date/Time Action Medication Dose   04/13/16 1230 Given   clindamycin (CLEOCIN) IVPB 900 mg 600 mg           Subjective: Complains of post-operative L hip pain  Objective: Filed Vitals:   04/13/16 1500 04/13/16 1530 04/13/16 1600 04/13/16 1623  BP: 96/67 98/71 101/72 108/78  Pulse: 25 66 70 86  Temp:  97.8 F (36.6 C) 97.6 F (36.4 C) 97.8 F (36.6 C)  TempSrc:      Resp: 15 16 16 16   Height:      Weight:      SpO2: 96% 96% 96% 90%    Intake/Output Summary (Last 24 hours) at 04/13/16 1645 Last data filed at 04/13/16 1338  Gross per 24 hour  Intake    900 ml  Output    900 ml  Net      0 ml    Filed Weights   04/12/16 2038 04/12/16 2310  Weight: 61.236 kg (135 lb) 51.982 kg (114 lb 9.6 oz)    Examination:  General exam: Appears calm and comfortable  Respiratory system: Clear to auscultation. Respiratory effort normal. Cardiovascular system: S1 & S2 heard, RRR.  Gastrointestinal system: Abdomen is nondistended, soft and nontender. No organomegaly or masses felt. Normal bowel sounds heard. Central nervous system: Alert and oriented. No focal neurological deficits. Extremities: Symmetric 5 x 5 power, postoperative dressings are in place. Skin: No rashes, lesions Psychiatry: Judgement and insight appear normal. Mood & affect appropriate.     Data Reviewed: I have personally reviewed following labs and imaging studies  CBC:  Recent Labs Lab 04/12/16 2045 04/13/16 0615 04/13/16 1400  WBC 9.9 14.3*  --   NEUTROABS 5.9  --   --   HGB 10.7* 10.8* 10.9*  HCT 33.8* 34.5* 34.3*  MCV 91.8 91.3  --   PLT 276 255  --    Basic Metabolic Panel:  Recent Labs Lab 04/12/16 2045 04/13/16 0615  NA 138 137  K 3.7 4.2  CL 103 103  CO2 29 29  GLUCOSE 113* 107*  BUN 32* 27*  CREATININE 0.97 0.91  CALCIUM 8.8* 8.5*   GFR: Estimated Creatinine Clearance: 34.4 mL/min (by C-G formula based on Cr of 0.91).  Liver Function Tests:  Recent Labs Lab 04/12/16 2045  AST 33  ALT 35  ALKPHOS 92  BILITOT 0.4  PROT 6.3*  ALBUMIN 3.2*   No results for input(s): LIPASE, AMYLASE in the last 168 hours. No results for input(s): AMMONIA in the last 168 hours. Coagulation Profile: No results for input(s): INR, PROTIME in the last 168 hours. Cardiac Enzymes: No results for input(s): CKTOTAL, CKMB, CKMBINDEX, TROPONINI in the last 168 hours. BNP (last 3 results) No results for input(s): PROBNP in the last 8760 hours. HbA1C: No results for input(s): HGBA1C in the last 72 hours. CBG: No results for input(s): GLUCAP in the last 168 hours. Lipid Profile: No results for input(s):  CHOL, HDL, LDLCALC, TRIG, CHOLHDL, LDLDIRECT in the last 72 hours. Thyroid Function Tests: No results for input(s): TSH, T4TOTAL, FREET4, T3FREE, THYROIDAB in the last 72 hours. Anemia Panel: No results for input(s): VITAMINB12, FOLATE, FERRITIN, TIBC, IRON, RETICCTPCT in the last 72 hours. Sepsis Labs: No results for input(s): PROCALCITON, LATICACIDVEN in the last 168 hours.  Recent Results (from the past 240 hour(s))  Surgical pcr screen     Status: Abnormal   Collection Time: 04/12/16 11:09 PM  Result Value Ref Range Status   MRSA, PCR POSITIVE (A) NEGATIVE Final    Comment: RESULT CALLED TO, READ BACK BY AND VERIFIED WITH: BULLOCK,T AT 1610 BY HUFFINES,S ON 04/13/16.    Staphylococcus aureus POSITIVE (A) NEGATIVE Final    Comment:        The Xpert SA Assay (FDA approved for NASAL specimens in patients over 55 years of age), is one component of a comprehensive surveillance program.  Test performance has been validated by [facility] for patients greater than or equal to 57 year old. It is not intended to diagnose infection nor to guide or monitor treatment.          Radiology Studies: Ct Head Wo Contrast  04/12/2016  CLINICAL DATA:  Status post fall, hitting back of head on floor. Initial encounter. EXAM: CT HEAD WITHOUT CONTRAST CT CERVICAL SPINE WITHOUT CONTRAST TECHNIQUE: Multidetector CT imaging of the head and cervical spine was performed following the standard protocol without intravenous contrast. Multiplanar CT image reconstructions of the cervical spine were also generated. COMPARISON:  CT of the head performed 11/12/2015, and CT of the cervical spine performed 12/21/2014 FINDINGS: CT HEAD FINDINGS There is no evidence of acute infarction, mass lesion, or intra- or extra-axial hemorrhage on CT. Prominence of ventricles and sulci reflects mild cortical volume loss. Cerebellar atrophy is noted. Scattered periventricular and subcortical white matter change likely  reflects small vessel ischemic microangiopathy. The brainstem and fourth ventricle are within normal limits. The basal ganglia are unremarkable in appearance. The cerebral hemispheres demonstrate grossly normal gray-white differentiation. No mass effect or midline shift is seen. There is no evidence of fracture; visualized osseous structures are unremarkable in appearance. The visualized portions of the orbits are within normal limits. The paranasal sinuses and mastoid air cells are well-aerated. No significant soft tissue abnormalities are seen. CT CERVICAL SPINE FINDINGS There is no evidence of fracture or subluxation. Vertebral bodies demonstrate normal height. There is mild grade 1 anterolisthesis of C5 on C6 and of C7 on T1, reflecting underlying facet disease. Mild disc space narrowing is noted at C5-C6. Prevertebral soft tissues are within normal limits. The thyroid gland is unremarkable in appearance. Minimal scarring is noted at the lung apices. Prominent calcification is seen at the carotid bifurcations bilaterally. IMPRESSION: 1. No evidence  of traumatic intracranial injury or fracture. 2. No evidence of fracture or subluxation along the cervical spine. 3. Mild cortical volume loss and scattered small vessel ischemic microangiopathy. 4. Mild degenerative change along the lower cervical spine. 5. Minimal scarring at the lung apices. 6. Prominent calcification at the carotid bifurcations bilaterally. Carotid ultrasound would be helpful for further evaluation, when and as deemed clinically appropriate. Electronically Signed   By: Roanna RaiderJeffery  Chang M.D.   On: 04/12/2016 21:18   Ct Cervical Spine Wo Contrast  04/12/2016  CLINICAL DATA:  Status post fall, hitting back of head on floor. Initial encounter. EXAM: CT HEAD WITHOUT CONTRAST CT CERVICAL SPINE WITHOUT CONTRAST TECHNIQUE: Multidetector CT imaging of the head and cervical spine was performed following the standard protocol without intravenous contrast.  Multiplanar CT image reconstructions of the cervical spine were also generated. COMPARISON:  CT of the head performed 11/12/2015, and CT of the cervical spine performed 12/21/2014 FINDINGS: CT HEAD FINDINGS There is no evidence of acute infarction, mass lesion, or intra- or extra-axial hemorrhage on CT. Prominence of ventricles and sulci reflects mild cortical volume loss. Cerebellar atrophy is noted. Scattered periventricular and subcortical white matter change likely reflects small vessel ischemic microangiopathy. The brainstem and fourth ventricle are within normal limits. The basal ganglia are unremarkable in appearance. The cerebral hemispheres demonstrate grossly normal gray-white differentiation. No mass effect or midline shift is seen. There is no evidence of fracture; visualized osseous structures are unremarkable in appearance. The visualized portions of the orbits are within normal limits. The paranasal sinuses and mastoid air cells are well-aerated. No significant soft tissue abnormalities are seen. CT CERVICAL SPINE FINDINGS There is no evidence of fracture or subluxation. Vertebral bodies demonstrate normal height. There is mild grade 1 anterolisthesis of C5 on C6 and of C7 on T1, reflecting underlying facet disease. Mild disc space narrowing is noted at C5-C6. Prevertebral soft tissues are within normal limits. The thyroid gland is unremarkable in appearance. Minimal scarring is noted at the lung apices. Prominent calcification is seen at the carotid bifurcations bilaterally. IMPRESSION: 1. No evidence of traumatic intracranial injury or fracture. 2. No evidence of fracture or subluxation along the cervical spine. 3. Mild cortical volume loss and scattered small vessel ischemic microangiopathy. 4. Mild degenerative change along the lower cervical spine. 5. Minimal scarring at the lung apices. 6. Prominent calcification at the carotid bifurcations bilaterally. Carotid ultrasound would be helpful for  further evaluation, when and as deemed clinically appropriate. Electronically Signed   By: Roanna RaiderJeffery  Chang M.D.   On: 04/12/2016 21:18   Chest Portable 1 View  04/13/2016  CLINICAL DATA:  Left hip fracture. EXAM: PORTABLE CHEST 1 VIEW COMPARISON:  Radiographs and CT 02/07/2016 FINDINGS: Previous nodular opacities in the right lung have near completely resolved, with minimal residual scarring about the right upper lobe. No new consolidation. Heart size and mediastinal contours are unchanged. Minimal vascular congestion, no alveolar edema. No pleural effusion or pneumothorax. Chronic superior subluxation of the right humeral head. IMPRESSION: Mild vascular congestion, and no alveolar edema. Minimal right upper lobe scarring. Electronically Signed   By: Rubye OaksMelanie  Ehinger M.D.   On: 04/13/2016 00:57   Dg C-arm 1-60 Min  04/13/2016  CLINICAL DATA:  ORIF left hip fracture EXAM: DG C-ARM 61-120 MIN COMPARISON:  None. FINDINGS: Multiple C-arm images show placement of 3 Knowles type pins for treatment of a femoral neck fracture. IMPRESSION: Placement of 3 Knowles pins for treatment of femoral neck fracture. Electronically Signed  By: Paulina Fusi M.D.   On: 04/13/2016 14:24   Dg Hip Unilat With Pelvis 2-3 Views Left  04/12/2016  CLINICAL DATA:  Fall today with left hip pain, initial encounter EXAM: DG HIP (WITH OR WITHOUT PELVIS) 2-3V LEFT COMPARISON:  03/30/2016 FINDINGS: Pelvic ring is intact. Postsurgical changes are seen within the proximal right femur. There is new subcapital femoral neck fracture with impaction at the fracture site on the left. No gross soft tissue abnormality is seen. No other focal abnormality is noted. IMPRESSION: Left subcapital femoral neck fracture with impaction at the fracture site. Electronically Signed   By: Alcide Clever M.D.   On: 04/12/2016 21:00     Scheduled Meds: . antiseptic oral rinse  7 mL Mouth Rinse BID  . Chlorhexidine Gluconate Cloth  6 each Topical Q0600  .  docusate sodium  100 mg Oral BID  . escitalopram  10 mg Oral Daily  . heparin  5,000 Units Subcutaneous Q8H  . levothyroxine  75 mcg Oral QAC breakfast  . mupirocin ointment  1 application Nasal BID  . pantoprazole  40 mg Oral Daily  . rOPINIRole  2 mg Oral QHS  . rosuvastatin  10 mg Oral Daily   Continuous Infusions:    LOS: 1 day     Natacha Jepsen, Scheryl Marten, MD [facility] Pager [phone]  If 7PM-7AM, please contact night-coverage [email or web address] Password [facility] 04/13/2016, 4:45 PM

## 2016-04-13 NOTE — Anesthesia Preprocedure Evaluation (Addendum)
Anesthesia Evaluation  Patient identified by MRN, date of birth, ID band Patient awake    Reviewed: Allergy & Precautions, NPO status , Patient's Chart, lab work & pertinent test results  History of Anesthesia Complications Negative for: history of anesthetic complications  Airway Mallampati: II  TM Distance: >3 FB     Dental  (+) Teeth Intact   Pulmonary neg pulmonary ROS,    breath sounds clear to auscultation       Cardiovascular hypertension, Pt. on medications  Rhythm:Regular Rate:Normal     Neuro/Psych PSYCHIATRIC DISORDERS Depression Hx dementia but answers appropriately and is oriented.    GI/Hepatic GERD  Medicated and Controlled,  Endo/Other  Hypothyroidism   Renal/GU      Musculoskeletal   Abdominal   Peds  Hematology   Anesthesia Other Findings   Reproductive/Obstetrics                            Anesthesia Physical Anesthesia Plan  ASA: III  Anesthesia Plan: Spinal   Post-op Pain Management:    Induction:   Airway Management Planned: Simple Face Mask  Additional Equipment:   Intra-op Plan:   Post-operative Plan:   Informed Consent: I have reviewed the patients History and Physical, chart, labs and discussed the procedure including the risks, benefits and alternatives for the proposed anesthesia with the patient or authorized representative who has indicated his/her understanding and acceptance.     Plan Discussed with:   Anesthesia Plan Comments:         Anesthesia Quick Evaluation

## 2016-04-13 NOTE — Transfer of Care (Signed)
Immediate Anesthesia Transfer of Care Note  Patient: Janet Patterson  Procedure(s) Performed: Procedure(s): CANNULATED HIP PINNING (Left)  Patient Location: PACU  Anesthesia Type:Spinal  Level of Consciousness: awake, alert  and oriented  Airway & Oxygen Therapy: Patient Spontanous Breathing  Post-op Assessment: Report given to RN and Post -op Vital signs reviewed and stable  Post vital signs: Reviewed and stable  Last Vitals:  Filed Vitals:   04/13/16 1140 04/13/16 1336  BP: 105/72   Pulse:    Temp:  36.4 C  Resp: 13     Last Pain:  Filed Vitals:   04/13/16 1337  PainSc: 7       Patients Stated Pain Goal: 5 (04/13/16 1019)  Complications: No apparent anesthesia complications

## 2016-04-14 ENCOUNTER — Encounter (HOSPITAL_COMMUNITY): Payer: Self-pay | Admitting: Orthopedic Surgery

## 2016-04-14 DIAGNOSIS — S72012A Unspecified intracapsular fracture of left femur, initial encounter for closed fracture: Principal | ICD-10-CM

## 2016-04-14 DIAGNOSIS — F329 Major depressive disorder, single episode, unspecified: Secondary | ICD-10-CM

## 2016-04-14 MED ORDER — OXYCODONE-ACETAMINOPHEN 5-325 MG PO TABS: 1.0000 | ORAL_TABLET | ORAL | Status: AC | PRN

## 2016-04-14 MED ORDER — DOCUSATE SODIUM 100 MG PO CAPS: 100.0000 mg | ORAL_CAPSULE | Freq: Two times a day (BID) | ORAL | Status: AC

## 2016-04-14 MED ORDER — POLYETHYLENE GLYCOL 3350 17 G PO PACK: 17.0000 g | PACK | Freq: Every day | ORAL | Status: AC | PRN

## 2016-04-14 MED ORDER — ASPIRIN 325 MG PO TBEC: 325.0000 mg | DELAYED_RELEASE_TABLET | Freq: Two times a day (BID) | ORAL | Status: AC

## 2016-04-14 MED ORDER — MUPIROCIN 2 % EX OINT: 1.0000 "application " | TOPICAL_OINTMENT | Freq: Two times a day (BID) | CUTANEOUS | Status: AC

## 2016-04-14 MED ORDER — MUPIROCIN 2 % EX OINT
1.0000 "application " | TOPICAL_OINTMENT | Freq: Two times a day (BID) | CUTANEOUS | Status: DC
  Administered 2016-04-14: 1 via NASAL
  Filled 2016-04-14: qty 22

## 2016-04-14 NOTE — Care Management Important Message (Signed)
Important Message  Patient Details  Name: Janet Patterson MRN: [id number] Date of Birth: 11/08/1924   Medicare Important Message Given:  Yes    Adonis HugueninBerkhead, Mckenze Slone L, RN 04/14/2016, 12:46 PM

## 2016-04-14 NOTE — Clinical Social Work Placement (Signed)
   CLINICAL SOCIAL WORK PLACEMENT  NOTE  Date:  04/14/2016  Patient Details  Name: Georganna SkeansMartha W Mendel MRN: [id number] Date of Birth: 04/14/1924  Clinical Social Work is seeking post-discharge placement for this patient at the Skilled  Nursing Facility level of care (*CSW will initial, date and re-position this form in  chart as items are completed):  Yes   Patient/family provided with [facility] Clinical Social Work Department's list of facilities offering this level of care within the geographic area requested by the patient (or if unable, by the patient's family).  Yes   Patient/family informed of their freedom to choose among providers that offer the needed level of care, that participate in Medicare, Medicaid or managed care program needed by the patient, have an available bed and are willing to accept the patient.  Yes   Patient/family informed of [facility]'s ownership interest in [facility] and [facility], as well as of the fact that they are under no obligation to receive care at these facilities.  PASRR submitted to EDS on       PASRR number received on       Existing PASRR number confirmed on 04/14/16     FL2 transmitted to all facilities in geographic area requested by pt/family on 04/14/16     FL2 transmitted to all facilities within larger geographic area on       Patient informed that his/her managed care company has contracts with or will negotiate with certain facilities, including the following:        Yes   Patient/family informed of bed offers received.  [facility]     Physician recommends and patient chooses bed at      Patient to [facility] on 04/14/16.  [facility]     Patient family notified on 04/14/16 of transfer.  Name of family member notified:  [name]- nephew     PHYSICIAN       Additional Comment:     _______________________________________________ Karn CassisStultz, Sylvana Bonk Shanaberger, LCSW 04/14/2016, 12:39 PM [phone]

## 2016-04-14 NOTE — Addendum Note (Signed)
Addendum  created 04/14/16 1449 by Earleen NewportAmy A Adams, CRNA   Modules edited: Charges VN

## 2016-04-14 NOTE — Clinical Social Work Placement (Signed)
   CLINICAL SOCIAL WORK PLACEMENT  NOTE  Date:  04/14/2016  Patient Details  Name: Janet SkeansMartha W Demeritt MRN: [id number] Date of Birth: 01/18/1924  Clinical Social Work is seeking post-discharge placement for this patient at the Skilled  Nursing Facility level of care (*CSW will initial, date and re-position this form in  chart as items are completed):  Yes   Patient/family provided with [facility] Clinical Social Work Department's list of facilities offering this level of care within the geographic area requested by the patient (or if unable, by the patient's family).  Yes   Patient/family informed of their freedom to choose among providers that offer the needed level of care, that participate in Medicare, Medicaid or managed care program needed by the patient, have an available bed and are willing to accept the patient.  Yes   Patient/family informed of [facility]'s ownership interest in [facility] and [facility], as well as of the fact that they are under no obligation to receive care at these facilities.  PASRR submitted to EDS on       PASRR number received on       Existing PASRR number confirmed on 04/14/16     FL2 transmitted to all facilities in geographic area requested by pt/family on 04/14/16     FL2 transmitted to all facilities within larger geographic area on       Patient informed that his/her managed care company has contracts with or will negotiate with certain facilities, including the following:            Patient/family informed of bed offers received.  Patient chooses bed at       Physician recommends and patient chooses bed at      Patient to be transferred to   on  .  Patient to be transferred to facility by       Patient family notified on   of transfer.  Name of family member notified:        PHYSICIAN       Additional Comment:    _______________________________________________ Karn CassisStultz, Vinaya Sancho Shanaberger, LCSW 04/14/2016, 8:46  AM [phone]

## 2016-04-14 NOTE — NC FL2 (Signed)
[place] MEDICAID FL2 LEVEL OF CARE SCREENING TOOL     IDENTIFICATION  Patient Name: Janet SkeansMartha W Patterson Birthdate: 09/23/1924 Sex: female Admission Date (Current Location): 04/12/2016  [facility] [place]:  Janet [place]   Facility and Address:  [facility],  [address], [place] [id number]      Provider Number: [id number]  Attending Physician Name and Address:  Vickki HearingStanley E Harrison, MD  Relative Name and Phone Number:       Current Level of Care: Hospital Recommended Level of Care: Skilled Nursing Facility Prior Approval Number:    Date Approved/Denied:   PASRR Number: [id number]  Discharge Plan: SNF    Current Diagnoses: Patient Active Problem List   Diagnosis Date Noted  . Femoral neck fracture, left, closed, initial encounter 04/13/2016  . Fall 04/12/2016  . Fracture of femur, subcapital, left, closed (HCC) 04/12/2016  . Depression 04/12/2016  . Memory difficulties 04/12/2016  . Hip fracture (HCC) 04/12/2016  . Cellulitis 12/13/2013    Orientation RESPIRATION BLADDER Height & Weight    Person, Place, Situation  O2 (2L) Incontinent Weight: 114 lb 9.6 oz (51.982 kg) Height:  5\' 4"  (162.6 cm)  BEHAVIORAL SYMPTOMS/MOOD NEUROLOGICAL BOWEL NUTRITION STATUS  Other (Comment) (n/a)  (n/a) Incontinent Diet (Clear liquid- progressing. See d/c summary for updates)  AMBULATORY STATUS COMMUNICATION OF NEEDS Skin   Extensive Assist Verbally Surgical wounds                       Personal Care Assistance Level of Assistance  Bathing, Feeding, Dressing Bathing Assistance: Maximum assistance Feeding assistance: Limited assistance Dressing Assistance: Maximum assistance     Functional Limitations Info  Sight, Hearing, Speech Sight Info: Adequate Hearing Info: Adequate Speech Info: Adequate    SPECIAL CARE FACTORS FREQUENCY  PT (By licensed PT)     PT Frequency: 5              Contractures Contractures Info: Not present    Additional  Factors Info  Isolation Precautions Code Status Info: Full code Allergies Info: Keflex     Isolation Precautions Info: 04/12/16 Surgical pcr screen = for MRSA     Current Medications (04/14/2016):  This is the current hospital active medication list Current Facility-Administered Medications  Medication Dose Route Frequency Provider Last Rate Last Dose  . 0.9 %  sodium chloride infusion   Intravenous Continuous Vickki HearingStanley E Harrison, MD 75 mL/hr at 04/13/16 1715    . acetaminophen (TYLENOL) tablet 500 mg  500 mg Oral Q4H PRN Vickki HearingStanley E Harrison, MD      . antiseptic oral rinse (CPC / CETYLPYRIDINIUM CHLORIDE 0.05%) solution 7 mL  7 mL Mouth Rinse BID Delano Metzobert Schertz, MD   7 mL at 04/13/16 2200  . aspirin EC tablet 325 mg  325 mg Oral BID Vickki HearingStanley E Harrison, MD      . Chlorhexidine Gluconate Cloth 2 % PADS 6 each  6 each Topical Q0600 Delano Metzobert Schertz, MD   6 each at 04/14/16 0543  . docusate sodium (COLACE) capsule 100 mg  100 mg Oral BID Delano Metzobert Schertz, MD   100 mg at 04/13/16 2302  . escitalopram (LEXAPRO) tablet 10 mg  10 mg Oral Daily Delano Metzobert Schertz, MD   10 mg at 04/13/16 1000  . levothyroxine (SYNTHROID, LEVOTHROID) tablet 75 mcg  75 mcg Oral QAC breakfast Delano Metzobert Schertz, MD   75 mcg at 04/13/16 0800  . morphine 2 MG/ML injection 1-2 mg  1-2 mg Intravenous Q3H  PRN Delano Metz, MD   2 mg at 04/13/16 1714  . ondansetron (ZOFRAN) injection 4 mg  4 mg Intravenous Q6H PRN Vickki Hearing, MD      . pantoprazole (PROTONIX) EC tablet 40 mg  40 mg Oral Daily Jerald Kief, MD   40 mg at 04/13/16 1000  . polyethylene glycol (MIRALAX / GLYCOLAX) packet 17 g  17 g Oral Daily PRN Delano Metz, MD      . rOPINIRole (REQUIP) tablet 2 mg  2 mg Oral QHS Delano Metz, MD   2 mg at 04/13/16 2302  . rosuvastatin (CRESTOR) tablet 10 mg  10 mg Oral Daily Delano Metz, MD   10 mg at 04/13/16 1000  . traMADol (ULTRAM) tablet 50 mg  50 mg Oral Q6H Vickki Hearing, MD   50 mg at 04/14/16 1610      Discharge Medications: Please see discharge summary for a list of discharge medications.  Relevant Imaging Results:  Relevant Lab Results:   Additional Information SS#: [id number]  Janet Patterson, [place] [phone]

## 2016-04-14 NOTE — Discharge Summary (Signed)
Physician Discharge Summary  Janet Patterson:[id number] DOB: [birth year] DOA: 04/12/2016  PCP: Johny Blamer, MD  Admit date: 04/12/2016 Discharge date: 04/14/2016  Time spent: 20 minutes  Recommendations for Outpatient Follow-up:  1. Follow up with PCP in 2-3 weeks 2. Per Dr. Romeo Apple: Weightbearing as tolerated no hip precautions Remove staples in 14 days from surgery approximately June 7 Follow-up x-ray should be done approximately June 7 Follow-up in the office should be approximately June 7 Aspirin for DVT prevention for 28 days  Discharge Diagnoses:  Principal Problem:   Fracture of femur, subcapital, left, closed (HCC) Active Problems:   Fall   Depression   Memory difficulties   Hip fracture (HCC)   Femoral neck fracture, left, closed, initial encounter   Discharge Condition: Stable  Diet recommendation: Heart healthy  Filed Weights   04/12/16 2038 04/12/16 2310  Weight: 61.236 kg (135 lb) 51.982 kg (114 lb 9.6 oz)    History of present illness:  Please review dictated H and P from 5/23 for details. Briefly, 82 y.o. female presented from [facility] in [place], said she was trying to go to the bathroom and fell. Had pain in L hip. Xrays showed a subcapital impacted fracture of the head of the femur. Patient was admitted for further work up.  Hospital Course:   Fall/ L hip fracture , subcapital  Orthopedic surgery was consulted.  Patient underwentpinning of the left femoral neck fracture on 04/13/2016  PT/OT as tolerated  Patient to continue therapy at SNF  Follow up with Orthopedic Surgery as per above  Depression /anxiety  Continued home meds  Remained stable  HL  Continued statin per home regimen  Hypothyroid  Continued levothyroxine per home regimen  Poor memory   Suspect questionable dementia  Procedures:  Pinning of left femoral neck fracture on 04/13/2016  Consultations:  Orthopedic Surgery  Discharge Exam: Filed Vitals:    04/13/16 2100 04/14/16 0034 04/14/16 0628 04/14/16 1222  BP: 109/53 115/60 117/60 130/61  Pulse: 81 72 70 80  Temp: 100.2 F (37.9 C) 99.4 F (37.4 C) 99.1 F (37.3 C) 99.3 F (37.4 C)  TempSrc: Oral Oral Oral   Resp: 18 18 16 18   Height:      Weight:      SpO2: 99% 92% 93% 92%    General: awake, in nad Cardiovascular: regular, s1, s2 Respiratory: normal resp effort, no wheezing  Discharge Instructions     Medication List    STOP taking these medications        traMADol 50 MG tablet  Commonly known as:  ULTRAM      TAKE these medications        aspirin 325 MG EC tablet  Take 1 tablet (325 mg total) by mouth 2 (two) times daily.     docusate sodium 100 MG capsule  Commonly known as:  COLACE  Take 1 capsule (100 mg total) by mouth 2 (two) times daily.     escitalopram 10 MG tablet  Commonly known as:  LEXAPRO  Take 10 mg by mouth daily.     lansoprazole 30 MG capsule  Commonly known as:  PREVACID  Take 30 mg by mouth daily at 12 noon.     levothyroxine 75 MCG tablet  Commonly known as:  SYNTHROID, LEVOTHROID  Take 75 mcg by mouth daily before breakfast.     mupirocin ointment 2 %  Commonly known as:  BACTROBAN  Place 1 application into the nose 2 (two) times daily.  oxyCODONE-acetaminophen 5-325 MG tablet  Commonly known as:  ROXICET  Take 1 tablet by mouth every 4 (four) hours as needed for severe pain.     polyethylene glycol packet  Commonly known as:  MIRALAX / GLYCOLAX  Take 17 g by mouth daily as needed for mild constipation.     rOPINIRole 2 MG tablet  Commonly known as:  REQUIP  Take 2 mg by mouth at bedtime.     rosuvastatin 10 MG tablet  Commonly known as:  CRESTOR  Take 10 mg by mouth daily.     temazepam 7.5 MG capsule  Commonly known as:  RESTORIL  Take one half of a pill at bed time for sleep     torsemide 20 MG tablet  Commonly known as:  DEMADEX  Take 40 mg by mouth 2 (two) times daily.       Allergies  Allergen  Reactions  . Keflex [Cephalexin] Hives   Follow-up Information    Follow up with [facility].   Specialty:  Skilled Nursing Facility   Contact information:   [address] [place] [address] [phone]      Follow up with Johny Blamer, MD. Schedule an appointment as soon as possible for a visit in 2 weeks.   Specialty:  Family Medicine   Why:  Hospital follow up   Contact information:   [address] [place] [address] [phone]        The results of significant diagnostics from this hospitalization (including imaging, microbiology, ancillary and laboratory) are listed below for reference.    Significant Diagnostic Studies: Ct Head Wo Contrast  04/12/2016  CLINICAL DATA:  Status post fall, hitting back of head on floor. Initial encounter. EXAM: CT HEAD WITHOUT CONTRAST CT CERVICAL SPINE WITHOUT CONTRAST TECHNIQUE: Multidetector CT imaging of the head and cervical spine was performed following the standard protocol without intravenous contrast. Multiplanar CT image reconstructions of the cervical spine were also generated. COMPARISON:  CT of the head performed 11/12/2015, and CT of the cervical spine performed 12/21/2014 FINDINGS: CT HEAD FINDINGS There is no evidence of acute infarction, mass lesion, or intra- or extra-axial hemorrhage on CT. Prominence of ventricles and sulci reflects mild cortical volume loss. Cerebellar atrophy is noted. Scattered periventricular and subcortical white matter change likely reflects small vessel ischemic microangiopathy. The brainstem and fourth ventricle are within normal limits. The basal ganglia are unremarkable in appearance. The cerebral hemispheres demonstrate grossly normal gray-white differentiation. No mass effect or midline shift is seen. There is no evidence of fracture; visualized osseous structures are unremarkable in appearance. The visualized portions of the orbits are within  normal limits. The paranasal sinuses and mastoid air cells are well-aerated. No significant soft tissue abnormalities are seen. CT CERVICAL SPINE FINDINGS There is no evidence of fracture or subluxation. Vertebral bodies demonstrate normal height. There is mild grade 1 anterolisthesis of C5 on C6 and of C7 on T1, reflecting underlying facet disease. Mild disc space narrowing is noted at C5-C6. Prevertebral soft tissues are within normal limits. The thyroid gland is unremarkable in appearance. Minimal scarring is noted at the lung apices. Prominent calcification is seen at the carotid bifurcations bilaterally. IMPRESSION: 1. No evidence of traumatic intracranial injury or fracture. 2. No evidence of fracture or subluxation along the cervical spine. 3. Mild cortical volume loss and scattered small vessel ischemic microangiopathy. 4. Mild degenerative change along the lower cervical spine. 5. Minimal scarring at the lung  apices. 6. Prominent calcification at the carotid bifurcations bilaterally. Carotid ultrasound would be helpful for further evaluation, when and as deemed clinically appropriate. Electronically Signed   By: Roanna Raider M.D.   On: 04/12/2016 21:18   Ct Cervical Spine Wo Contrast  04/12/2016  CLINICAL DATA:  Status post fall, hitting back of head on floor. Initial encounter. EXAM: CT HEAD WITHOUT CONTRAST CT CERVICAL SPINE WITHOUT CONTRAST TECHNIQUE: Multidetector CT imaging of the head and cervical spine was performed following the standard protocol without intravenous contrast. Multiplanar CT image reconstructions of the cervical spine were also generated. COMPARISON:  CT of the head performed 11/12/2015, and CT of the cervical spine performed 12/21/2014 FINDINGS: CT HEAD FINDINGS There is no evidence of acute infarction, mass lesion, or intra- or extra-axial hemorrhage on CT. Prominence of ventricles and sulci reflects mild cortical volume loss. Cerebellar atrophy is noted. Scattered  periventricular and subcortical white matter change likely reflects small vessel ischemic microangiopathy. The brainstem and fourth ventricle are within normal limits. The basal ganglia are unremarkable in appearance. The cerebral hemispheres demonstrate grossly normal gray-white differentiation. No mass effect or midline shift is seen. There is no evidence of fracture; visualized osseous structures are unremarkable in appearance. The visualized portions of the orbits are within normal limits. The paranasal sinuses and mastoid air cells are well-aerated. No significant soft tissue abnormalities are seen. CT CERVICAL SPINE FINDINGS There is no evidence of fracture or subluxation. Vertebral bodies demonstrate normal height. There is mild grade 1 anterolisthesis of C5 on C6 and of C7 on T1, reflecting underlying facet disease. Mild disc space narrowing is noted at C5-C6. Prevertebral soft tissues are within normal limits. The thyroid gland is unremarkable in appearance. Minimal scarring is noted at the lung apices. Prominent calcification is seen at the carotid bifurcations bilaterally. IMPRESSION: 1. No evidence of traumatic intracranial injury or fracture. 2. No evidence of fracture or subluxation along the cervical spine. 3. Mild cortical volume loss and scattered small vessel ischemic microangiopathy. 4. Mild degenerative change along the lower cervical spine. 5. Minimal scarring at the lung apices. 6. Prominent calcification at the carotid bifurcations bilaterally. Carotid ultrasound would be helpful for further evaluation, when and as deemed clinically appropriate. Electronically Signed   By: Roanna Raider M.D.   On: 04/12/2016 21:18   Chest Portable 1 View  04/13/2016  CLINICAL DATA:  Left hip fracture. EXAM: PORTABLE CHEST 1 VIEW COMPARISON:  Radiographs and CT 02/07/2016 FINDINGS: Previous nodular opacities in the right lung have near completely resolved, with minimal residual scarring about the right  upper lobe. No new consolidation. Heart size and mediastinal contours are unchanged. Minimal vascular congestion, no alveolar edema. No pleural effusion or pneumothorax. Chronic superior subluxation of the right humeral head. IMPRESSION: Mild vascular congestion, and no alveolar edema. Minimal right upper lobe scarring. Electronically Signed   By: Rubye Oaks M.D.   On: 04/13/2016 00:57   Dg C-arm 1-60 Min  04/13/2016  CLINICAL DATA:  ORIF left hip fracture EXAM: DG C-ARM 61-120 MIN COMPARISON:  None. FINDINGS: Multiple C-arm images show placement of 3 Knowles type pins for treatment of a femoral neck fracture. IMPRESSION: Placement of 3 Knowles pins for treatment of femoral neck fracture. Electronically Signed   By: Paulina Fusi M.D.   On: 04/13/2016 14:24   Dg Hip Unilat With Pelvis 2-3 Views Left  04/12/2016  CLINICAL DATA:  Fall today with left hip pain, initial encounter EXAM: DG HIP (WITH OR WITHOUT PELVIS) 2-3V LEFT  COMPARISON:  03/30/2016 FINDINGS: Pelvic ring is intact. Postsurgical changes are seen within the proximal right femur. There is new subcapital femoral neck fracture with impaction at the fracture site on the left. No gross soft tissue abnormality is seen. No other focal abnormality is noted. IMPRESSION: Left subcapital femoral neck fracture with impaction at the fracture site. Electronically Signed   By: Alcide CleverMark  Lukens M.D.   On: 04/12/2016 21:00    Microbiology: Recent Results (from the past 240 hour(s))  Surgical pcr screen     Status: Abnormal   Collection Time: 04/12/16 11:09 PM  Result Value Ref Range Status   MRSA, PCR POSITIVE (A) NEGATIVE Final    Comment: RESULT CALLED TO, READ BACK BY AND VERIFIED WITH: BULLOCK,T AT [id number] BY HUFFINES,S ON 04/13/16.    Staphylococcus aureus POSITIVE (A) NEGATIVE Final    Comment:        The Xpert SA Assay (FDA approved for NASAL specimens in patients over 521 years of age), is one component of a comprehensive  surveillance program.  Test performance has been validated by [facility] for patients greater than or equal to 80 year old. It is not intended to diagnose infection nor to guide or monitor treatment.      Labs: Basic Metabolic Panel:  Recent Labs Lab 04/12/16 2045 04/13/16 0615  NA 138 137  K 3.7 4.2  CL 103 103  CO2 29 29  GLUCOSE 113* 107*  BUN 32* 27*  CREATININE 0.97 0.91  CALCIUM 8.8* 8.5*   Liver Function Tests:  Recent Labs Lab 04/12/16 2045  AST 33  ALT 35  ALKPHOS 92  BILITOT 0.4  PROT 6.3*  ALBUMIN 3.2*   No results for input(s): LIPASE, AMYLASE in the last 168 hours. No results for input(s): AMMONIA in the last 168 hours. CBC:  Recent Labs Lab 04/12/16 2045 04/13/16 0615 04/13/16 1400  WBC 9.9 14.3*  --   NEUTROABS 5.9  --   --   HGB 10.7* 10.8* 10.9*  HCT 33.8* 34.5* 34.3*  MCV 91.8 91.3  --   PLT 276 255  --    Cardiac Enzymes: No results for input(s): CKTOTAL, CKMB, CKMBINDEX, TROPONINI in the last 168 hours. BNP: BNP (last 3 results) No results for input(s): BNP in the last 8760 hours.  ProBNP (last 3 results) No results for input(s): PROBNP in the last 8760 hours.  CBG: No results for input(s): GLUCAP in the last 168 hours.   Signed:  Shaunae Sieloff, Scheryl MartenSTEPHEN K  [facility] 04/14/2016, 12:31 PM

## 2016-04-14 NOTE — Progress Notes (Addendum)
Patient ID: Janet Patterson, female   DOB: 08/11/1924, 82 y.o.   MRN: [id number] Orthopedics  Postop day 1 left femoral neck fracture status post cannulated screw fixation  Dressing shows scant sanguinous drainage The patient remains confused but she says her left hip is not as sore as it was before  Her ankles show no edema calf muscles are supple,  she has normal dorsiflexion and plantar flexion of the foot and normal sensation in the foot  CBC Latest Ref Rng 04/13/2016 04/13/2016 04/12/2016  WBC 4.0 - 10.5 K/uL - 14.3(H) 9.9  Hemoglobin 12.0 - 15.0 g/dL 10.9(L) 10.8(L) 10.7(L)  Hematocrit 36.0 - 46.0 % 34.3(L) 34.5(L) 33.8(L)  Platelets 150 - 400 K/uL - 255 276    BMP Latest Ref Rng 04/13/2016 04/12/2016 12/13/2013  Glucose 65 - 99 mg/dL 409(W107(H) 119(J113(H) 82  BUN 6 - 20 mg/dL 47(W27(H) 29(F32(H) 10  Creatinine 0.44 - 1.00 mg/dL 6.210.91 3.080.97 6.570.88  Sodium 135 - 145 mmol/L 137 138 142  Potassium 3.5 - 5.1 mmol/L 4.2 3.7 3.9  Chloride 101 - 111 mmol/L 103 103 107  CO2 22 - 32 mmol/L 29 29 27   Calcium 8.9 - 10.3 mg/dL 8.4(O8.5(L) 9.6(E8.8(L) 9.1    Start physical therapy weightbearing as tolerated  Patient may be discharged to appropriate skilled nursing facility when she stabilizes from a medical point of view  Postoperative plan Weightbearing as tolerated no hip precautions Remove staples in 14 days from surgery approximately June 7 Follow-up x-ray should be done approximately June 7 Follow-up in the office should be approximately June 7 Aspirin for DVT prevention for 28 days

## 2016-04-14 NOTE — Evaluation (Signed)
Physical Therapy Evaluation Patient Details Name: Janet Patterson MRN: [id number] DOB: 05/18/1924 Today's Date: 04/14/2016   History of Present Illness  82 yo F admitted [facility]p [facility] with subcapital impacted fx of L head of femur, now s/p cannulated hip pinning on 04/13/2016.  PMH: Dementia, HTN, hypothyroid, GERD, IBS, OA.   Clinical Impression  Pt received in bed, and was agreeable to PT evaluation.  PLOF limited due to poor historian.  Pt states she ambulates with assistance and [facility]W at [facility].  She receives assistance for both dressing and bathing.  Today, she required Max A for bed mobility, and sit<>stand x 2 trials.  Pt not able to perform lateral weight shifting due to fear and pain, therefore no further ambulation performed.  Pt is recommended for SNF.     Follow Up Recommendations SNF    Equipment Recommendations  None recommended by PT    Recommendations for Other Services       Precautions / Restrictions Precautions Precautions: Fall Restrictions Weight Bearing Restrictions: Yes LLE Weight Bearing: Weight bearing as tolerated      Mobility  Bed Mobility Overal bed mobility: Needs Assistance Bed Mobility: Supine to Sit     Supine to sit: Max assist;HOB elevated Sit to supine: Total assist   General bed mobility comments: pt requires increased time, moves B LE's together due to fear and pain, but not able to separate with vc's or tc's at this time. Once sitting at the EOB she requires constant Min A due to posterior pushing. Total A for pt to return back into the bed due to poor processing of transfer and what PT was asking her to do.   Transfers Overall transfer level: Needs assistance Equipment used: Rolling walker (2 wheeled) Transfers: Sit to/from Stand Sit to Stand: Max assist         General transfer comment: x2 trials from the bed.  Pt needs constant vc's to shift weight anteriorly, and keep feet on the floor.  Pt holds LE's very rigid, and  demonstrates LE tremors. After ~30 sec she is able to stay standing with Min A.  Unable to perform lateral weight shifting at this time due to fear and pain.   Ambulation/Gait Ambulation/Gait assistance:  (NA)              Stairs            Wheelchair Mobility    Modified Rankin (Stroke Patients Only)       Balance Overall balance assessment: Needs assistance Sitting-balance support: Bilateral upper extremity supported Sitting balance-Leahy Scale: Poor Sitting balance - Comments: Posterior and right lean - needing constant vc's to keep feet on the floor. Requires constant Min A to maintain balance.    Standing balance support: Bilateral upper extremity supported Standing balance-Leahy Scale: Poor Standing balance comment: Posterior lean                             Pertinent Vitals/Pain Pain Assessment: 0-10 Pain Score: 8  Pain Location: L hip Pain Descriptors / Indicators: Aching Pain Intervention(s): Limited activity within patient's tolerance    Home Living   Living Arrangements: Other (Comment) (Pt lives at an ALF- [facility]) Available Help at Discharge: Personal care attendant;Available 24 hours/day           Home Equipment: Bedside commode;Walker - 2 wheels      Prior Function Level of Independence: Needs assistance   Gait /  Transfers Assistance Needed: Pt uses a RW, and ambulates with assistance to the dining hall.    ADL's / Homemaking Assistance Needed: Pt receives assistance from aides for dressing and bathing        Hand Dominance        Extremity/Trunk Assessment   Upper Extremity Assessment: Generalized weakness           Lower Extremity Assessment: Generalized weakness (Unable to formally test due to poor understanding of MMT instructions)         Communication      Cognition Arousal/Alertness: Awake/alert Behavior During Therapy: WFL for tasks assessed/performed Overall Cognitive Status: History of  cognitive impairments - at baseline                      General Comments General comments (skin integrity, edema, etc.): Pt with noted increased tone, cues for relaxation and breathing, B LE tremors.      Exercises        Assessment/Plan    PT Assessment Patient needs continued PT services  PT Diagnosis Difficulty walking;Abnormality of gait;Generalized weakness   PT Problem List Decreased strength;Decreased range of motion;Decreased activity tolerance;Decreased balance;Decreased mobility;Decreased coordination;Decreased cognition;Decreased knowledge of use of DME;Decreased safety awareness;Decreased knowledge of precautions;Cardiopulmonary status limiting activity;Impaired tone;Pain  PT Treatment Interventions DME instruction;Gait training;Functional mobility training;Therapeutic activities;Therapeutic exercise;Patient/family education;Cognitive remediation;Balance training   PT Goals (Current goals can be found in the Care Plan section) Acute Rehab PT Goals Patient Stated Goal: pt wants to get some rehab.  PT Goal Formulation: With patient Time For Goal Achievement: 04/21/16 Potential to Achieve Goals: Good    Frequency 7X/week   Barriers to discharge        Co-evaluation               End of Session Equipment Utilized During Treatment: Gait belt;Oxygen Activity Tolerance: Patient limited by pain Patient left: in bed Nurse Communication: Mobility status    Functional Assessment Tool Used: [employer] AM-PAc "6-clicks"  Functional Limitation: Mobility: Walking and moving around Mobility: Walking and Moving Around Current Status [phone]): At least 60 percent but less than 80 percent impaired, limited or restricted Mobility: Walking and Moving Around Goal Status [phone]): At least 40 percent but less than 60 percent impaired, limited or restricted    Time: 9629-5284 PT Time Calculation (min) (ACUTE ONLY): 41 min   Charges:   PT Evaluation $PT Eval  Moderate Complexity: 1 Procedure PT Treatments $Therapeutic Activity: 23-37 mins   PT G Codes:   PT G-Codes **NOT FOR INPATIENT CLASS** Functional Assessment Tool Used: [employer] AM-PAc "6-clicks"  Functional Limitation: Mobility: Walking and moving around Mobility: Walking and Moving Around Current Status [phone]): At least 60 percent but less than 80 percent impaired, limited or restricted Mobility: Walking and Moving Around Goal Status [phone]): At least 40 percent but less than 60 percent impaired, limited or restricted    Waynetta Sandy Kolbee Stallman, PT, DPT X: [phone]   04/14/2016, 1:05 PM

## 2016-04-14 NOTE — Progress Notes (Signed)
Discharged to [facility], condition stable, out via stretcher by [facility], reported to Kendrick Friesonnie Ingram, LPN.

## 2016-04-14 NOTE — Clinical Social Work Note (Signed)
Clinical Social Work Assessment  Patient Details  Name: Janet Patterson MRN: [id number] Date of Birth: 10/18/1924  Date of referral:  04/14/16               Reason for consult:  Discharge Planning                Permission sought to share information with:  Family Supports Permission granted to share information::  Yes, Verbal Permission Granted  Name::     Research scientist (medical)::     Relationship::  nephew  Contact Information:     Housing/Transportation Living arrangements for the past 2 months:  [place] of Information:  Patient, Other (Comment Required) (nephew) Patient Interpreter Needed:  None Criminal Activity/Legal Involvement Pertinent to Current Situation/Hospitalization:  No - Comment as needed Significant Relationships:  Other Family Members Lives with:  Facility Resident Do you feel safe going back to the place where you live?   (needs higher level of care) Need for family participation in patient care:  Yes (Comment)  Care giving concerns:  Pt from ALF, will need higher level of care.   Social Worker assessment / plan:  CSW met with pt at bedside. Pt alert and oriented x3. She states that she fell going to the bathroom and broke her hip. Post op day 1. Pt was at [facility] for rehab and had transferred to [facility] about 3 weeks ago. Pt's nephew, Janet Patterson is next of kin and best support. CSW discussed need for higher level of care with pt and she requested that CSW call [facility]. This was also discussed with Janet Patterson who agrees and requests return to [facility] as they were pleased with care there. Per Nira Conn at [facility], pt primarily used wheelchair and ambulated very rarely. However, she got up to bathroom on her own when she fell.   Employment status:  Retired Nurse, adult PT Recommendations:  [place] / Referral to community resources:  [place]  Patient/Family's Response to care:  Pt and nephew agreeable to initiate SNF bed search.   Patient/Family's Understanding of and Emotional Response to Diagnosis, Current Treatment, and Prognosis:  Pt and nephew aware of treatment plan, including need for higher level of care and are agreeable.   Emotional Assessment Appearance:  Appears stated age Attitude/Demeanor/Rapport:  Other (Cooperative) Affect (typically observed):  Appropriate Orientation:  Oriented to Self, Oriented to Place, Oriented to Situation Alcohol / Substance use:  Not Applicable Psych involvement (Current and /or in the community):  No (Comment)  Discharge Needs  Concerns to be addressed:  Discharge Planning Concerns Readmission within the last 30 days:  No Current discharge risk:  Physical Impairment Barriers to Discharge:  Continued Medical Work up   Salome Arnt, [place] 04/14/2016, 8:51 AM [phone]

## 2016-04-16 LAB — TYPE AND SCREEN
ABO/RH(D): A POS
ANTIBODY SCREEN: NEGATIVE
UNIT DIVISION: 0
Unit division: 0

## 2016-04-19 ENCOUNTER — Telehealth: Payer: Self-pay | Admitting: Orthopedic Surgery

## 2016-04-19 NOTE — Telephone Encounter (Signed)
Patient had femoral neck fx status post cannulated screw fixation done on 04-13-16.  She is at [facility].  Lurena Joinerebecca, RN from [facility] called and asked about appointment for this patient.  Dr. Starling MannsHarrision had dictated that he wanted the patient xrayed prior to an appointment on 04-27-16.  Appointment has been made for 04-27-16 but they need an xray order faxed to them.  Please fax xray prior order to Lurena JoinerRebecca at [facility] at fax # [phone].  She said if you have any questions, please call her at [phone].   Thanks

## 2016-04-20 ENCOUNTER — Ambulatory Visit: Payer: Medicare Other

## 2016-04-20 ENCOUNTER — Other Ambulatory Visit: Payer: Self-pay | Admitting: *Deleted

## 2016-04-20 DIAGNOSIS — S72142D Displaced intertrochanteric fracture of left femur, subsequent encounter for closed fracture with routine healing: Secondary | ICD-10-CM

## 2016-04-20 NOTE — Telephone Encounter (Signed)
Order faxed as requested

## 2016-04-27 ENCOUNTER — Ambulatory Visit (HOSPITAL_COMMUNITY)
Admission: RE | Admit: 2016-04-27 | Discharge: 2016-04-27 | Disposition: A | Discharge status: Home / Self Care | Payer: Medicare Other | Source: Ambulatory Visit | Attending: Orthopedic Surgery | Admitting: Orthopedic Surgery

## 2016-04-27 ENCOUNTER — Ambulatory Visit (INDEPENDENT_AMBULATORY_CARE_PROVIDER_SITE_OTHER): Payer: 59 | Admitting: Orthopedic Surgery

## 2016-04-27 ENCOUNTER — Encounter: Payer: Self-pay | Admitting: Orthopedic Surgery

## 2016-04-27 VITALS — Ht 64.0 in | Wt 114.0 lb

## 2016-04-27 DIAGNOSIS — Z981 Arthrodesis status: Secondary | ICD-10-CM | POA: Diagnosis not present

## 2016-04-27 DIAGNOSIS — S72142D Displaced intertrochanteric fracture of left femur, subsequent encounter for closed fracture with routine healing: Secondary | ICD-10-CM | POA: Diagnosis present

## 2016-04-27 DIAGNOSIS — Z4789 Encounter for other orthopedic aftercare: Secondary | ICD-10-CM

## 2016-04-27 DIAGNOSIS — X58XXXD Exposure to other specified factors, subsequent encounter: Secondary | ICD-10-CM | POA: Insufficient documentation

## 2016-04-27 NOTE — Patient Instructions (Signed)
TAKE STAPLES OUT TODAY   XRAYS HERE 4 WEEKS   WBAT

## 2016-04-27 NOTE — Progress Notes (Signed)
Patient ID: Georganna SkeansMartha W Gibler, female   DOB: 08/07/1924, 82 y.o.   MRN: [id number]  Chief Complaint  Patient presents with  . Follow-up    post op 1, left hip pinning, DOS 04/13/16    Ht 5\' 4"  (1.626 m)  Wt 114 lb (51.71 kg)  BMI 19.56 kg/m2  POD 14 LEFT HIP CANNULATED SCREWS   DOING WELL INCISION HAS STAPLES THAT NEED TO COME OUT     ASSESSMENT AND PLAN   WBAT  STAPLES OUT TODAY  XRAYS 4 WEEKS

## 2016-05-25 ENCOUNTER — Encounter: Payer: Self-pay | Admitting: Orthopedic Surgery

## 2016-05-25 ENCOUNTER — Ambulatory Visit: Payer: Medicare Other

## 2016-05-25 ENCOUNTER — Ambulatory Visit: Payer: Medicare Other | Admitting: Orthopedic Surgery

## 2016-05-25 VITALS — BP 105/62 | HR 80 | Temp 97.0°F | Wt 100.4 lb

## 2016-05-25 DIAGNOSIS — S72002D Fracture of unspecified part of neck of left femur, subsequent encounter for closed fracture with routine healing: Secondary | ICD-10-CM

## 2016-05-25 NOTE — Progress Notes (Signed)
Chief Complaint  Patient presents with  . Follow-up    Hip pinning on 04/13/2016  Postop day 42 left hip cannulated screws  Patient not ambulatory enough to take x-ray will be RE-schedule to get x-ray.

## 2016-06-20 ENCOUNTER — Ambulatory Visit (HOSPITAL_COMMUNITY)
Admission: RE | Admit: 2016-06-20 | Discharge: 2016-06-20 | Disposition: A | Discharge status: Home / Self Care | Payer: 59 | Source: Ambulatory Visit | Attending: Orthopedic Surgery | Admitting: Orthopedic Surgery

## 2016-06-20 ENCOUNTER — Encounter: Payer: Self-pay | Admitting: Orthopedic Surgery

## 2016-06-20 ENCOUNTER — Ambulatory Visit: Payer: Medicare Other | Admitting: Orthopedic Surgery

## 2016-06-20 VITALS — BP 102/55

## 2016-06-20 DIAGNOSIS — Z9889 Other specified postprocedural states: Secondary | ICD-10-CM | POA: Insufficient documentation

## 2016-06-20 DIAGNOSIS — X58XXXD Exposure to other specified factors, subsequent encounter: Secondary | ICD-10-CM | POA: Insufficient documentation

## 2016-06-20 DIAGNOSIS — S72002D Fracture of unspecified part of neck of left femur, subsequent encounter for closed fracture with routine healing: Secondary | ICD-10-CM

## 2016-06-20 DIAGNOSIS — S72142D Displaced intertrochanteric fracture of left femur, subsequent encounter for closed fracture with routine healing: Secondary | ICD-10-CM | POA: Insufficient documentation

## 2016-06-20 DIAGNOSIS — Z4789 Encounter for other orthopedic aftercare: Secondary | ICD-10-CM

## 2016-06-20 NOTE — Progress Notes (Signed)
Chief Complaint  Patient presents with  . Follow-up    LEFT HIP PINNING, DOS 04/13/16    BP (!) 102/55   X-ray shows appropriate pin position fractures healing appropriately  Patient will x-ray again in the office in one month continue weightbearing as tolerated in physical therapy.

## 2016-07-18 ENCOUNTER — Ambulatory Visit (INDEPENDENT_AMBULATORY_CARE_PROVIDER_SITE_OTHER): Payer: 59

## 2016-07-18 ENCOUNTER — Ambulatory Visit (INDEPENDENT_AMBULATORY_CARE_PROVIDER_SITE_OTHER): Payer: 59 | Admitting: Orthopedic Surgery

## 2016-07-18 VITALS — BP 107/60 | HR 66 | Temp 97.5°F | Ht 64.0 in

## 2016-07-18 DIAGNOSIS — S72002D Fracture of unspecified part of neck of left femur, subsequent encounter for closed fracture with routine healing: Secondary | ICD-10-CM

## 2016-07-18 NOTE — Progress Notes (Signed)
Chief Complaint  Patient presents with  . Follow-up    Follow up on left hip, DOS 04-13-16.    BP 107/60   Pulse 66   Temp 97.5 F (36.4 C)   Ht 5\' 4"  (1.626 m)   Approximately 12 weeks postop left hip cannulated screw fixation here for follow-up x-ray  The patient is not ambulating well. The fracture is healed well the hardware stable  Orthopedic reason that we can tell clinically why she shouldn't be able to walk  She is actually 96 days postop now  Follow-up as needed continue gait training

## 2018-03-09 IMAGING — DX DG HIP (WITH OR WITHOUT PELVIS) 2-3V*L*
3 series · 3 of 3 positions shown · non-contrast
Comparison: 04/27/2016

CLINICAL DATA: Fall [REDACTED] E with left hip fracture and subsequent
repair, evaluate healing

EXAM:
DG HIP (WITH OR WITHOUT PELVIS) 2-3V LEFT

[pelvis ap]
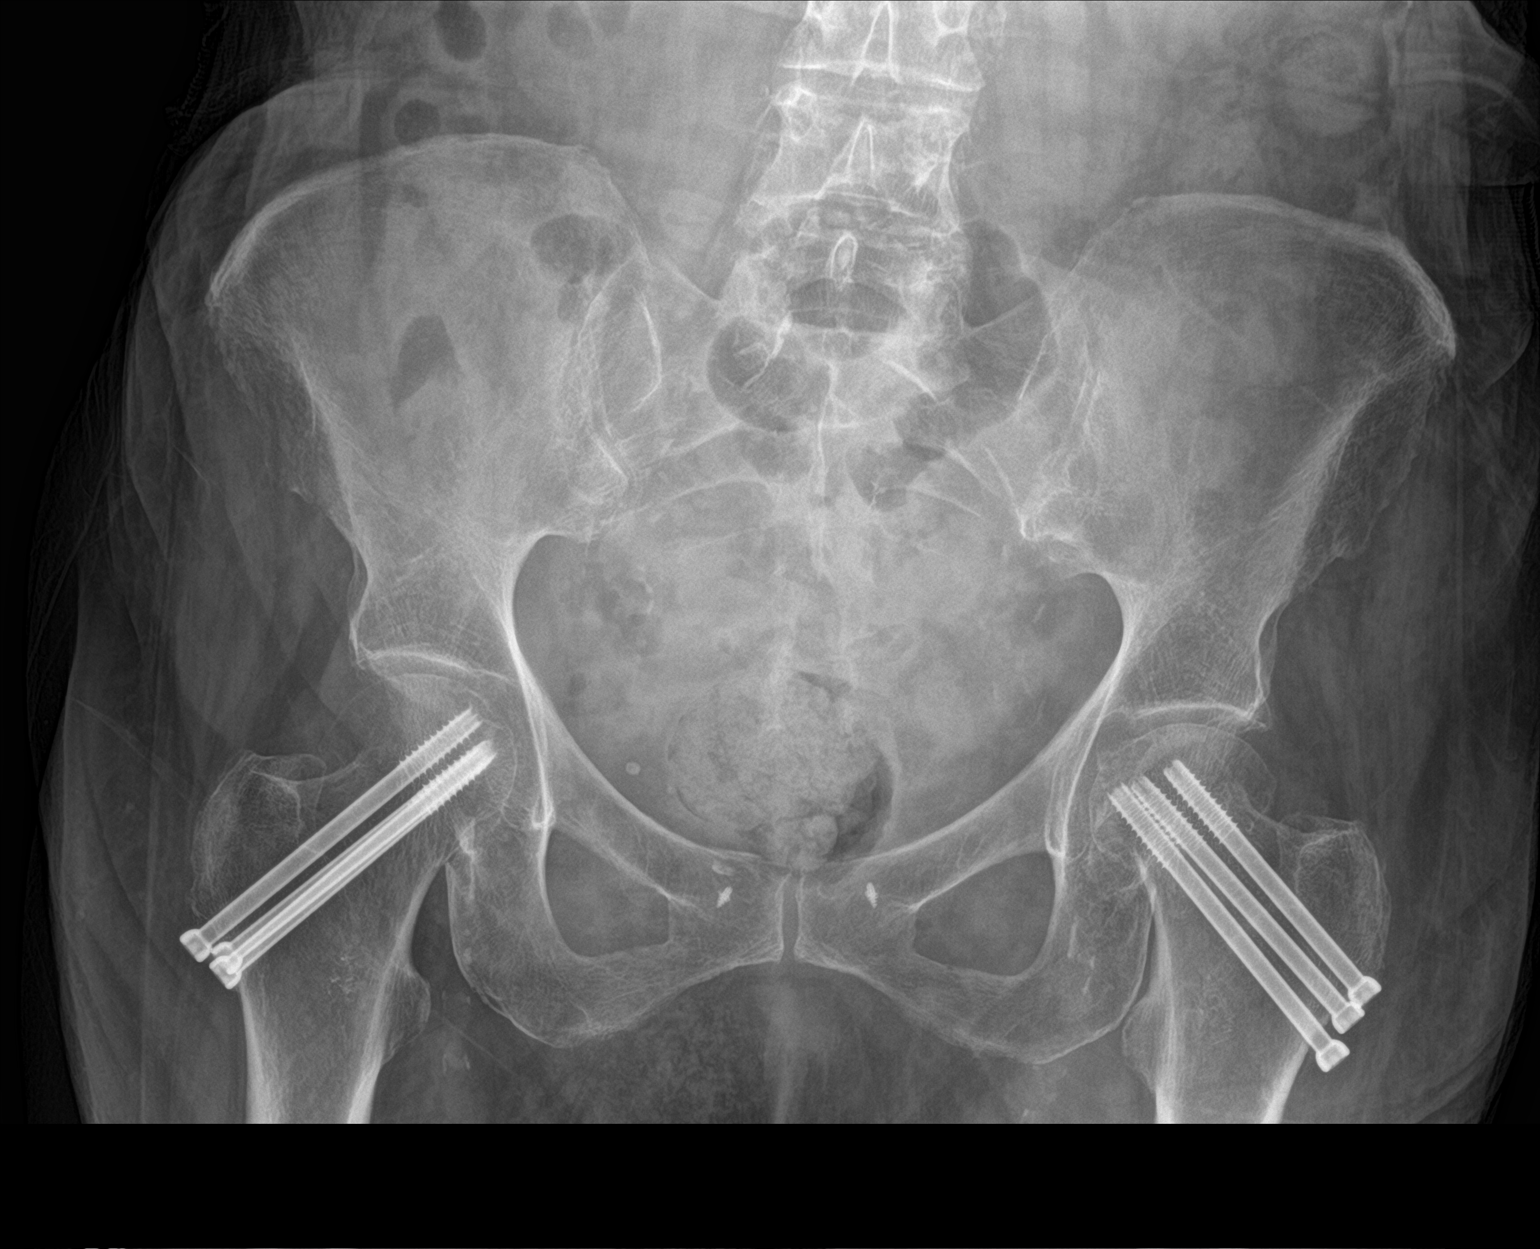

[hip ap]
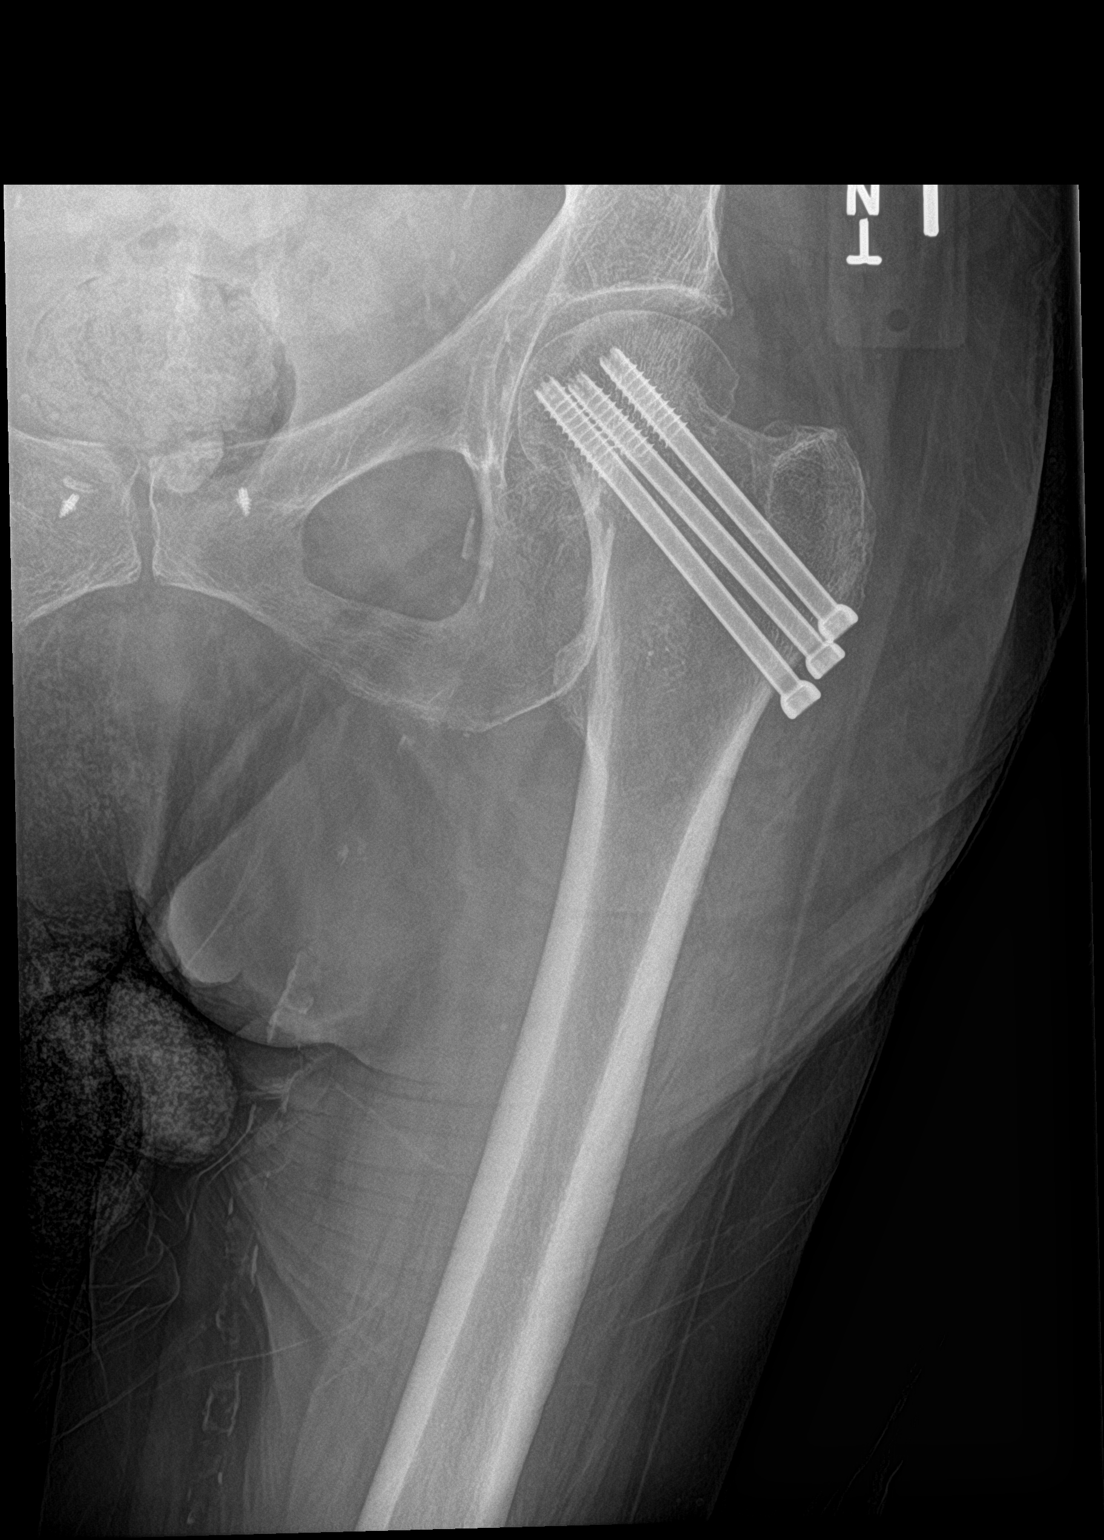

[hip lat]
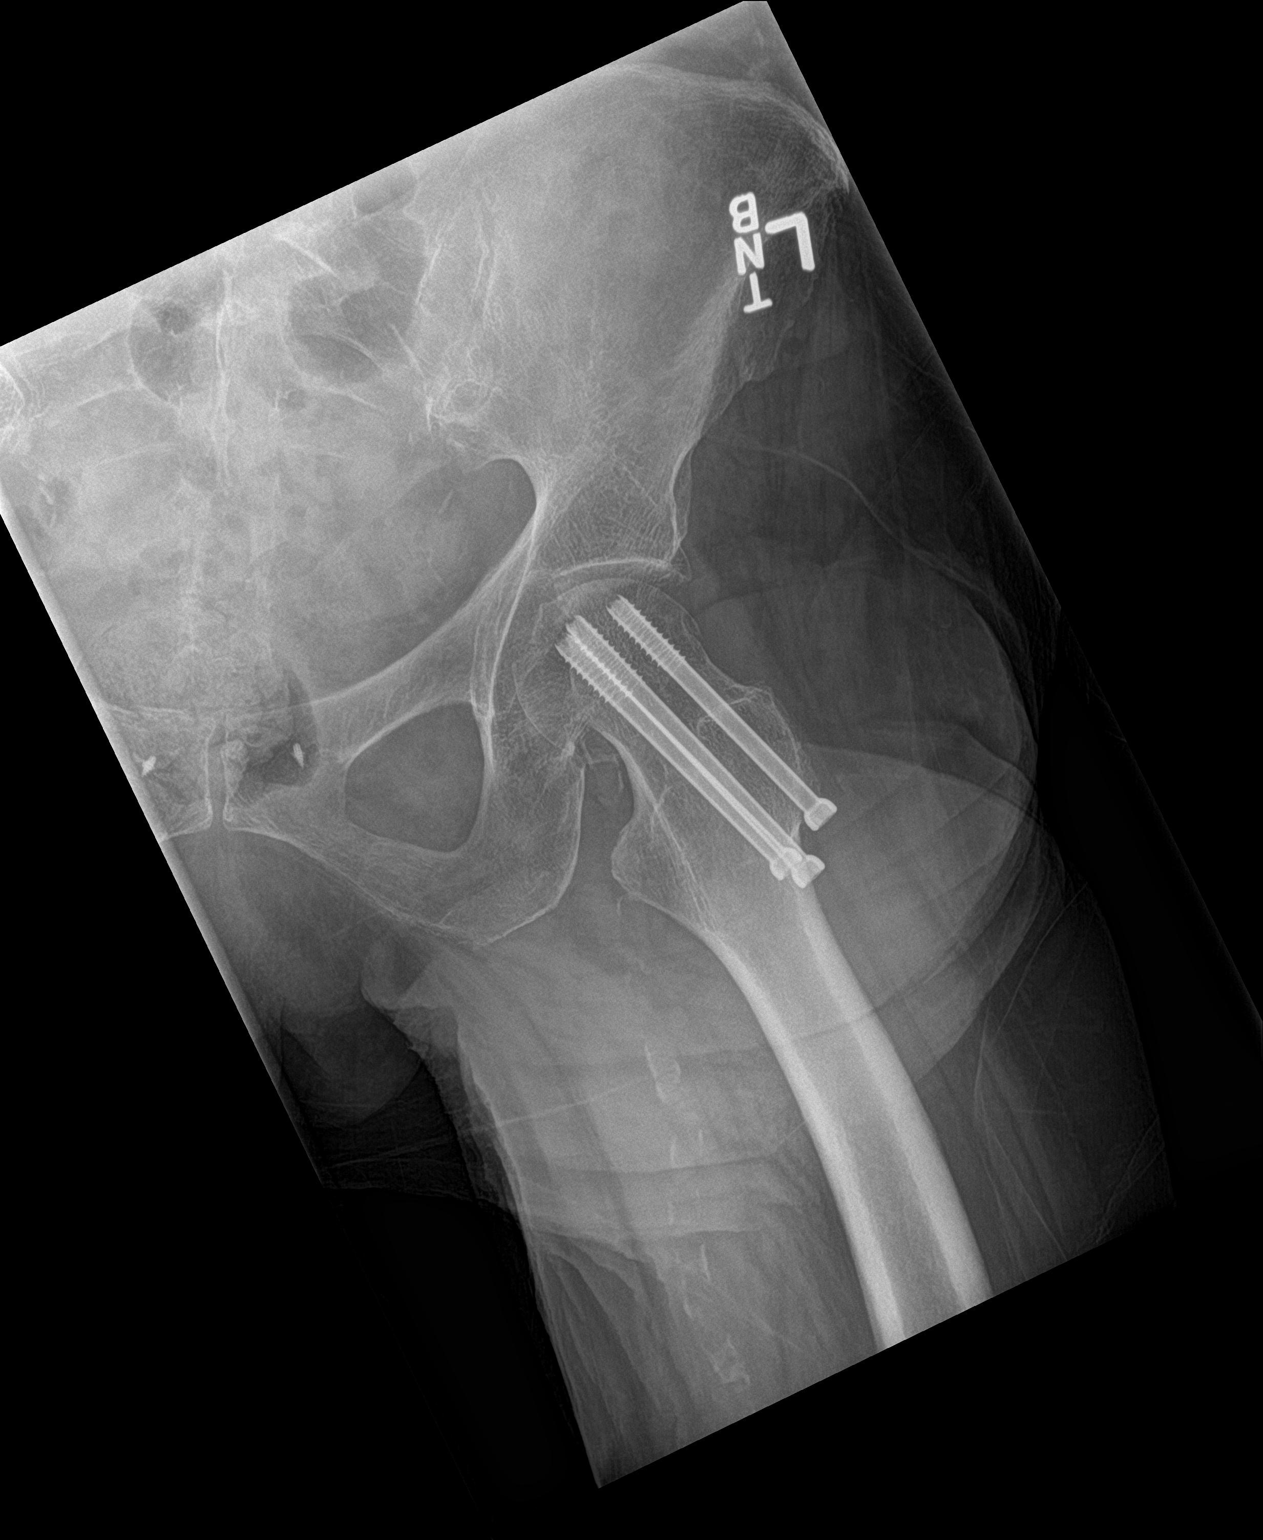

[3 of 3 positions shown; findings below may reference images not displayed]

FINDINGS: Fixation screws have been placed bilaterally for treatment of
subcapital femoral neck fractures. Fractures are stable in
appearance. Mild osteopenia is noted. No acute fracture is seen.
Mild degenerative change of the lumbar spine is seen.
IMPRESSION: Postoperative change consistent with fracture fixation. The overall
appearance is stable from the previous exam.

## 2019-12-23 DEATH — deceased
